# Patient Record
Sex: Female | Born: 1995 | Race: White | Hispanic: No | Marital: Single | Smoking: Never smoker
Health system: Southern US, Community
[De-identification: ages and names within clinical notes are randomized; demographics above are authoritative.]

## PROBLEM LIST (undated history)

## (undated) DIAGNOSIS — F32A Depression, unspecified: Secondary | ICD-10-CM

## (undated) DIAGNOSIS — F909 Attention-deficit hyperactivity disorder, unspecified type: Secondary | ICD-10-CM

## (undated) DIAGNOSIS — F411 Generalized anxiety disorder: Secondary | ICD-10-CM

## (undated) DIAGNOSIS — J45909 Unspecified asthma, uncomplicated: Secondary | ICD-10-CM

## (undated) DIAGNOSIS — F329 Major depressive disorder, single episode, unspecified: Secondary | ICD-10-CM

## (undated) HISTORY — DX: Generalized anxiety disorder: F41.1

## (undated) HISTORY — DX: Unspecified asthma, uncomplicated: J45.909

## (undated) HISTORY — DX: Attention-deficit hyperactivity disorder, unspecified type: F90.9

## (undated) HISTORY — PX: WISDOM TOOTH EXTRACTION: SHX21

---
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
## 2013-01-08 ENCOUNTER — Other Ambulatory Visit: Payer: Self-pay | Admitting: Family Medicine

## 2013-01-08 NOTE — Telephone Encounter (Signed)
Looks like it was last refilled 5/19 with an additional refill, so it should last until her July appt.  Verify with pharmacy. It looks like they have been contacting us monthly for refill, even though additional refill was given (4/25 and 5/19 refilled)

## 2013-01-08 NOTE — Telephone Encounter (Signed)
Is this okay to refill? She has a CPE for college scheduled for next month.

## 2013-01-24 ENCOUNTER — Ambulatory Visit (INDEPENDENT_AMBULATORY_CARE_PROVIDER_SITE_OTHER): Payer: Commercial Managed Care - PPO | Admitting: Family Medicine

## 2013-01-24 ENCOUNTER — Encounter: Payer: Self-pay | Admitting: Family Medicine

## 2013-01-24 VITALS — BP 102/62 | HR 72 | Ht 68.0 in | Wt 136.0 lb

## 2013-01-24 DIAGNOSIS — N943 Premenstrual tension syndrome: Secondary | ICD-10-CM

## 2013-01-24 DIAGNOSIS — J45990 Exercise induced bronchospasm: Secondary | ICD-10-CM

## 2013-01-24 DIAGNOSIS — Z0000 Encounter for general adult medical examination without abnormal findings: Secondary | ICD-10-CM

## 2013-01-24 DIAGNOSIS — N946 Dysmenorrhea, unspecified: Secondary | ICD-10-CM

## 2013-01-24 DIAGNOSIS — R142 Eructation: Secondary | ICD-10-CM

## 2013-01-24 DIAGNOSIS — F3281 Premenstrual dysphoric disorder: Secondary | ICD-10-CM

## 2013-01-24 DIAGNOSIS — R141 Gas pain: Secondary | ICD-10-CM

## 2013-01-24 MED ORDER — DEXLANSOPRAZOLE 60 MG PO CPDR: 60.0000 mg | DELAYED_RELEASE_CAPSULE | Freq: Every day | ORAL | Status: DC

## 2013-01-24 MED ORDER — DROSPIRENONE-ETHINYL ESTRADIOL 3-0.02 MG PO TABS: ORAL_TABLET | ORAL | Status: DC

## 2013-01-24 MED ORDER — ALBUTEROL SULFATE HFA 108 (90 BASE) MCG/ACT IN AERS: 2.0000 | INHALATION_SPRAY | Freq: Four times a day (QID) | RESPIRATORY_TRACT | Status: DC | PRN

## 2013-01-24 NOTE — Progress Notes (Signed)
Chief Complaint  Patient presents with  . Annual Exam    college CPE. Needs rx for birth control pills and albuterol inhaler. Would like to learn how to do self breast exam. Still concerned over her burping issues.    Meagan Miller is a 17 y.o. female who presents for a complete physical.  She is accompanied by her mother. She has the following concerns:  She had her wisdom teeth all removed yesterday.  Is taking pain pills, and had nausea/vomited while waiting for visit today, as side effect of pain pills.  She is lying down, sipping water, currently feeling a little better.  Burping is less than it used to be, but is still loud and bothers her parents.  Not related to meals/eating, occurs several times/day.  Denies dysphagia.  Only took PPI for a few days, caused her to pass gas, so didn't try it again.  Never tried changing PPI to the prevacid as previously suggested (had the gas from prilosec OTC).  She denies actual heartburn, dysphagia, or other bowel changes.  Anxiety is improved.  She has been working out a lot, which helps reduce her stress. She is down 10 pounds since her last visit. She saw Willette Alma (after Dr. Dub Mikes), and tried short-acting Focalin BID around exam time, which didn't help, so then retried Adderall, which helped the most, but made her sick (nausea). She has also tried concerta, adderall, strattera, Wellbutrin and vyvanse in the past (all since 9th grade).    Exercise-induced asthma--Hasn't needed to use inhaler in quite a while.  Previously used it prior to soccer practice and games, and she didn't play soccer this year.  Doesn't need it for working out at the gym.  Dysmenorrhea and PMDD--has been on OCP's since last visit, and periods are lighter, shorter, less painful, and she is less emotional. She denies side effects, and is requesting refill.  She does not have a boyfriend, and has never been sexually active.  Going to [employer] in the fall.  Immunization  History  Administered Date(s) Administered  . DTaP 10/04/1994, 11/18/1994, 01/31/1995, 11/03/1995, 12/10/1999  . HPV Quadrivalent 04/27/2006, 06/29/2006, 12/27/2007  . Hepatitis A 04/26/2005, 04/27/2006  . Hepatitis B 29-Apr-1994, 08/25/1994, 04/25/1995  . HiB 10/04/1994, 11/18/1994, 01/31/1995, 11/03/1995  . IPV 10/04/1994, 11/18/1994, 01/31/1995, 12/10/1999  . Influenza Split 08/04/2009, 09/06/2011, 04/24/2012  . Influenza Whole 05/19/2004, 04/26/2005, 04/27/2006  . MMR 07/25/1995, 12/10/1999  . Meningococcal Conjugate 12/27/2007, 09/06/2011  . Tdap 04/26/2005, 09/09/2011  . Varicella 07/25/1995, 12/27/2007   Last Pap smear: never Dentist: a few months ago; wisdom teeth removed yesterday Ophtho: never Exercise: 5x/week  Past Medical History  Diagnosis Date  . Depression   . Generalized anxiety disorder     previously treated with zoloft (Dr. Dub Mikes, Dr. Hal Hope)  . ADHD (attention deficit hyperactivity disorder) 9th grade  . Asthma     Past Surgical History  Procedure Laterality Date  . Wisdom tooth extraction      History   Social History  . Marital Status: Single    Spouse Name: N/A    Number of Children: N/A  . Years of Education: N/A   Occupational History  . student    Social History Main Topics  . Smoking status: Never Smoker   . Smokeless tobacco: Not on file  . Alcohol Use: No  . Drug Use: No  . Sexually Active: Not on file   Other Topics Concern  . Not on file   Social History Narrative  Graduated from [employer].  Lives with parents, sister.  Going to [employer] in the fall    Family History  Problem Relation Age of Onset  . Asthma Mother   . Anxiety disorder Mother   . Cancer Father 29    colon  . Asthma Sister   . Anxiety disorder Sister   . Cancer Maternal Aunt     great aunt--ovarian cancer  . Anxiety disorder Maternal Aunt   . Anxiety disorder Maternal Uncle   . Anxiety disorder Maternal Grandmother   . Cancer Cousin      breast cancer (1st cousins once removed)--late 71's for 2 cousins  . Breast cancer Cousin   . Stroke Cousin    Current outpatient prescriptions:drospirenone-ethinyl estradiol (GIANVI) 3-0.02 MG tablet, TAKE 1 TABLET BY MOUTH DAILY., Disp: 28 tablet, Rfl: 11;  albuterol (PROVENTIL HFA;VENTOLIN HFA) 108 (90 BASE) MCG/ACT inhaler, Inhale 2 puffs into the lungs every 6 (six) hours as needed for wheezing., Disp: 1 Inhaler, Rfl: 1;  dexlansoprazole (DEXILANT) 60 MG capsule, Take 1 capsule (60 mg total) by mouth daily., Disp: 5 capsule, Rfl: 0 (Dexilant sample given today, NOT prior to visit)  No Known Allergies  ROS: +intentional weight loss.  No fevers, URI symptoms, headaches, chest pain, palpitations, nausea, vomiting, bowel changes, vaginal discharge, urinary complaints, joint pains, bleeding/bruising, skin lesions/rashes, cough, shortness of breath.  Denies depression.  Anxiety is improved.  See HPI  PHYSICAL EXAM: BP 102/62  Pulse 72  Ht 5\' 8"  (1.727 m)  Wt 136 lb (61.689 kg)  BMI 20.68 kg/m2  LMP 01/11/2013 Pleasant, thin female in no distress HEENT:  PERRL, EOMI, conjunctiva clear.  Fundi benign.  TM's and EAC's normal, OP clear. Neck: no lymphadenopathy, thyromegaly or mass Heart: regular rate and rhythm without murmur Lungs: clear bilaterally Breasts:  No nipple discharge, inversion, dominant masses or axillary lymphadenopathy Abdomen: soft, nontender, no mass Extremities: no edema, 2+ pulse Back: no CVA tenderness, spinal tenderness or scoliosis Psych: normal mood, affect, hygiene and grooming Neuro: alert and oriented.  Cranial nerves intact.  Normal strength, sensation, DTR's.  ASSESSMENT/PLAN:  Routine general medical examination at a health care facility - Plan: POCT Urinalysis Dipstick, Visual acuity screening  Dysmenorrhea - Plan: drospirenone-ethinyl estradiol (GIANVI) 3-0.02 MG tablet  PMDD (premenstrual dysphoric disorder) - improved with OCP's - Plan:  drospirenone-ethinyl estradiol (GIANVI) 3-0.02 MG tablet  Belching - as previously discussed with Dr. Elnoria Howard, likely has no increased long-term risks.  she will try PPI to see if belching improves.  Dexilant samples given - Plan: dexlansoprazole (DEXILANT) 60 MG capsule  Exercise-induced asthma - needs refill to have inhaler available for school - Plan: albuterol (PROVENTIL HFA;VENTOLIN HFA) 108 (90 BASE) MCG/ACT inhaler  Trial of PPI just to see if symptoms improve or not.  Samples given of Dexilant, just to see if belching improves, not that she needs to take it longterm.  Can also try OTC Prevacid prn.  Reviewed risks of untreated reflux, but discussed that her symptoms (belching) may not necessarily truly represent reflux.  Counseled extensively regarding avoidance of smoking, alcohol, drugs; abstinence encouraged, safe sex reviewed (especially since she is on OCP's for her PMDD).  Counseled re: sunscreen, healthy eating, safe driving habits. All questions answered.  Immunizations UTD.  Forms filled out for school  Nausea and vomiting, likely related to narcotic.  Recommended stopping narcotic pain meds, and use tylenol or ibuprofen as needed for pain instead.

## 2013-01-24 NOTE — Patient Instructions (Signed)
HEALTH MAINTENANCE RECOMMENDATIONS:  It is recommended that you get at least 30 minutes of aerobic exercise at least 5 days/week (for weight loss, you may need as much as 60-90 minutes). This can be any activity that gets your heart rate up. This can be divided in 10-15 minute intervals if needed, but try and build up your endurance at least once a week.  Weight bearing exercise is also recommended twice weekly.  Eat a healthy diet with lots of vegetables, fruits and fiber.  "Colorful" foods have a lot of vitamins (ie green vegetables, tomatoes, red peppers, etc).  Limit sweet tea, regular sodas and alcoholic beverages, all of which has a lot of calories and sugar.  Up to 1 alcoholic drink daily may be beneficial for women (unless trying to lose weight, watch sugars).  Drink a lot of water.  Calcium recommendations are 1200-1500 mg daily (1500 mg for postmenopausal women or women without ovaries), and vitamin D 1000 IU daily.  This should be obtained from diet and/or supplements (vitamins), and calcium should not be taken all at once, but in divided doses.  Monthly self breast exams and yearly mammograms for women over the age of 75 is recommended.  Sunscreen of at least SPF 30 should be used on all sun-exposed parts of the skin when outside between the hours of 10 am and 4 pm (not just when at beach or pool, but even with exercise, golf, tennis, and yard work!)  Use a sunscreen that says "broad spectrum" so it covers both UVA and UVB rays, and make sure to reapply every 1-2 hours.  Remember to change the batteries in your smoke detectors when changing your clock times in the spring and fall.  Use your seat belt every time you are in a car, and please drive safely and not be distracted with cell phones and texting while driving.   Prevacid is the over-the-counter acid medication that you can try to see if it helps with belching.  If it doesn't help, you do not need to continue to take it.

## 2013-01-29 IMAGING — CR DG SKULL COMPLETE 4+V
4 series · 4 of 4 positions shown · non-contrast
Comparison: None.

CLINICAL DATA: Head trauma 3 days ago.  Pain.

SKULL - COMPLETE 4 + VIEW

[[person_name]]
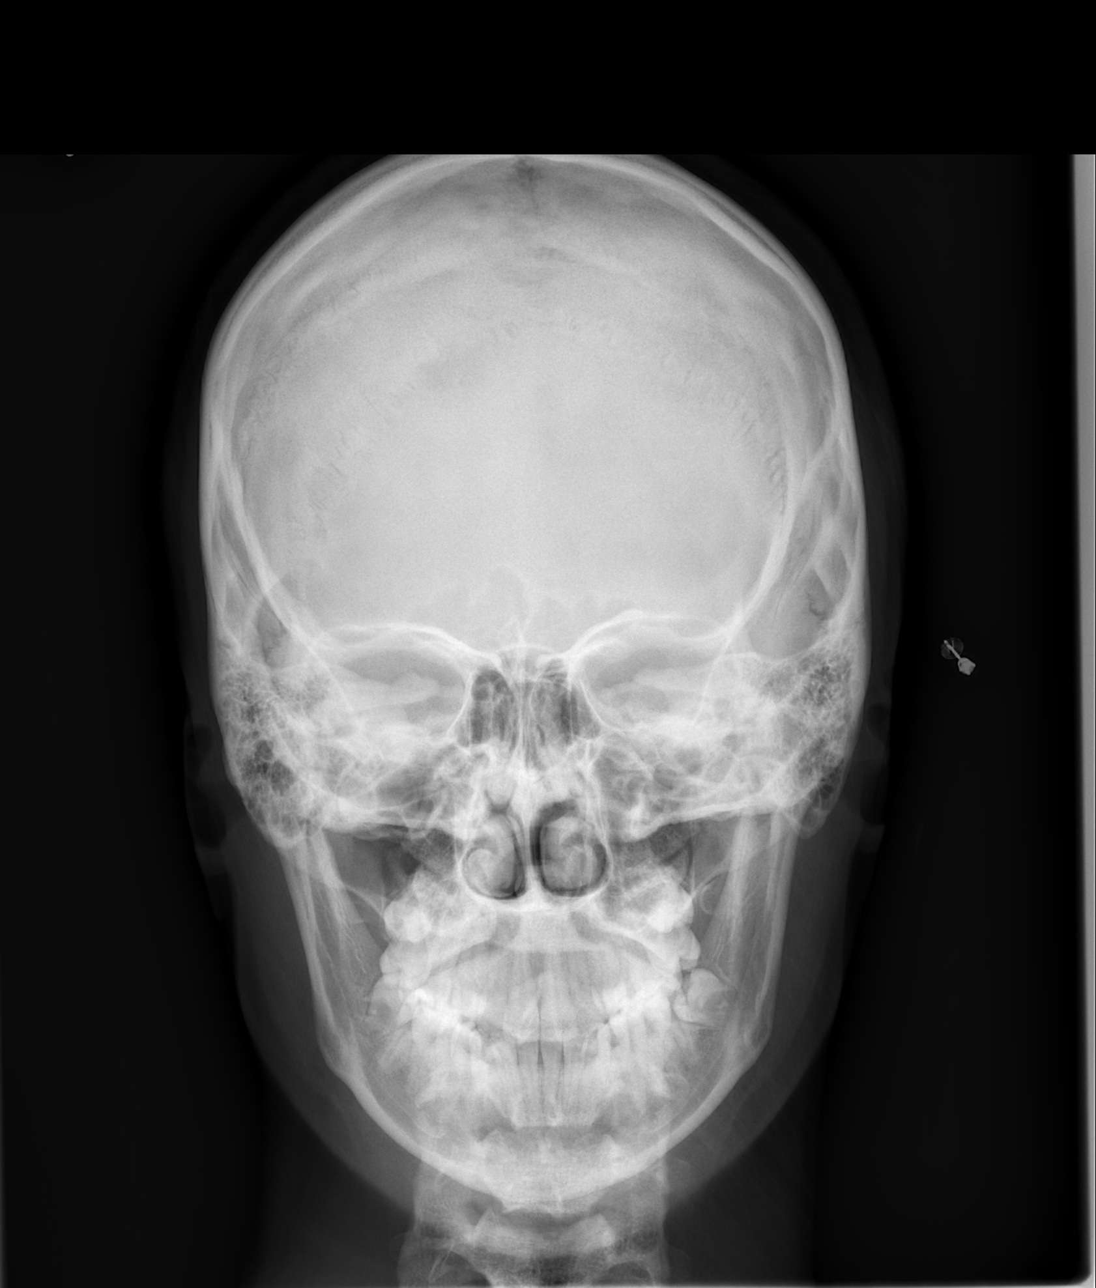

[w townes]
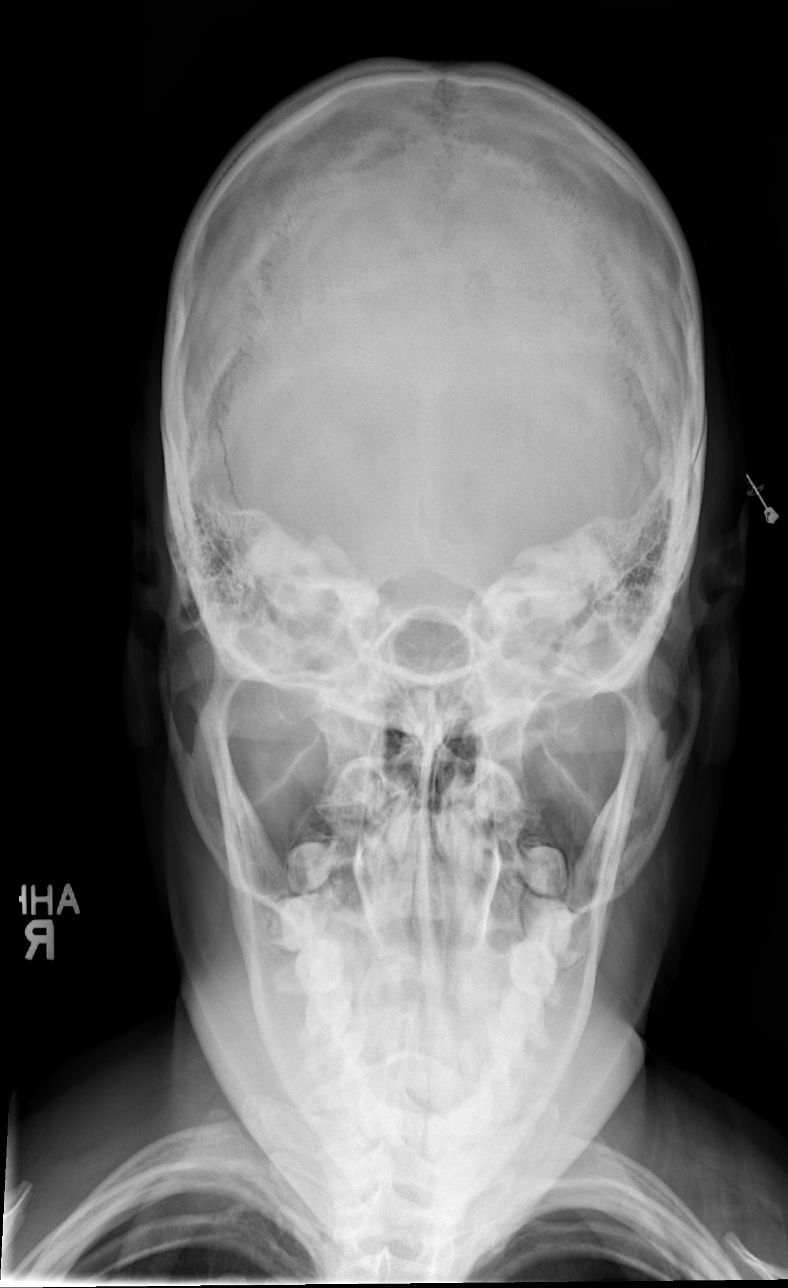

[w skull lat (1 of 2)]
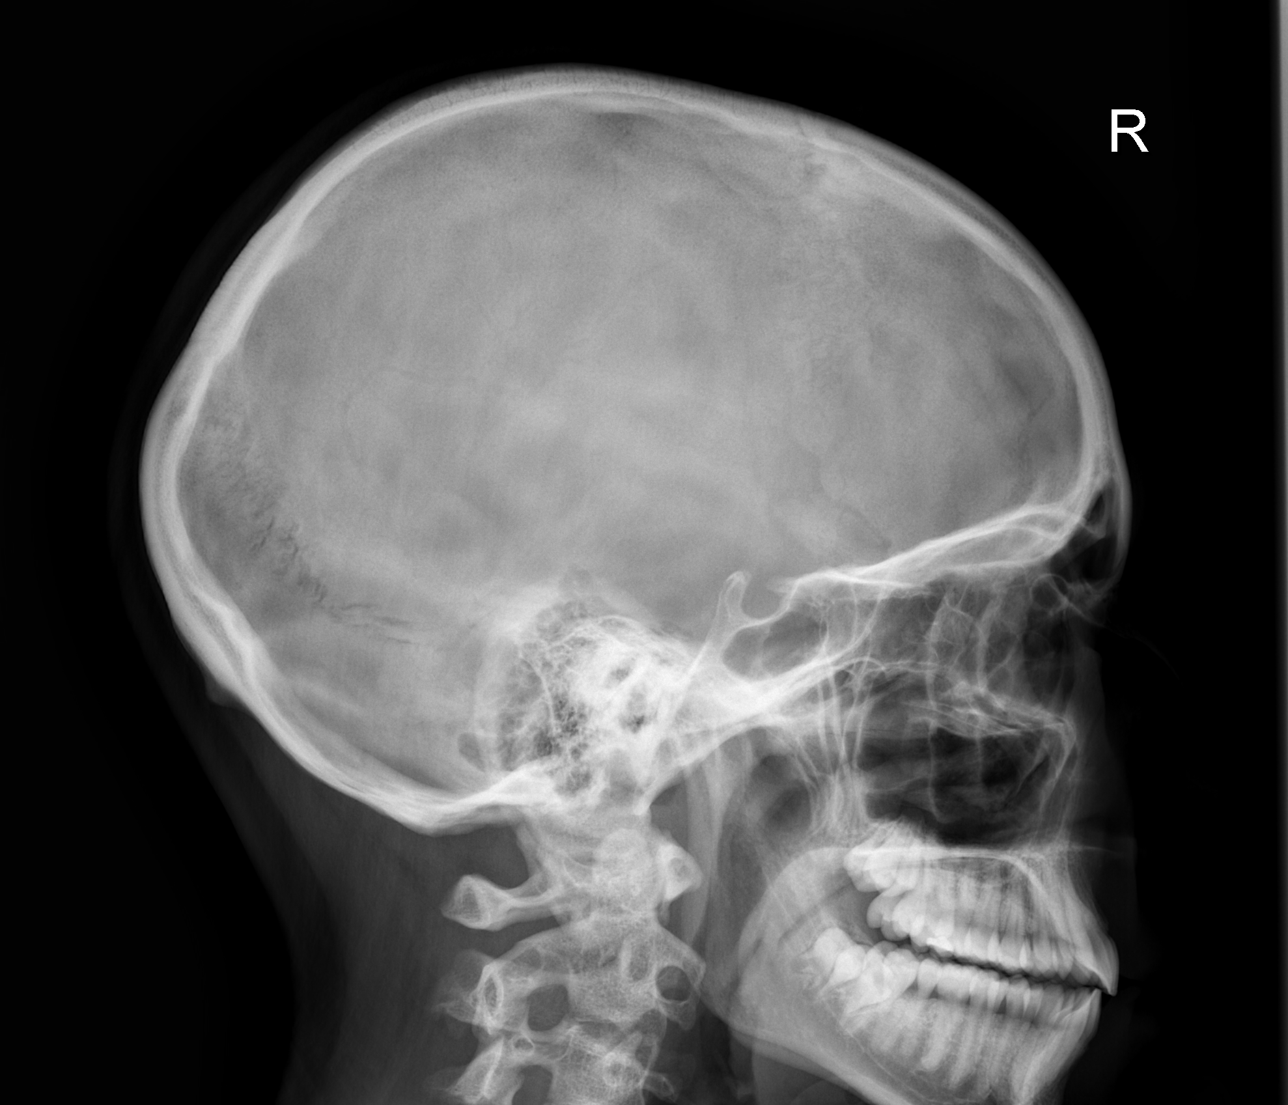

[w skull lat (2 of 2)]
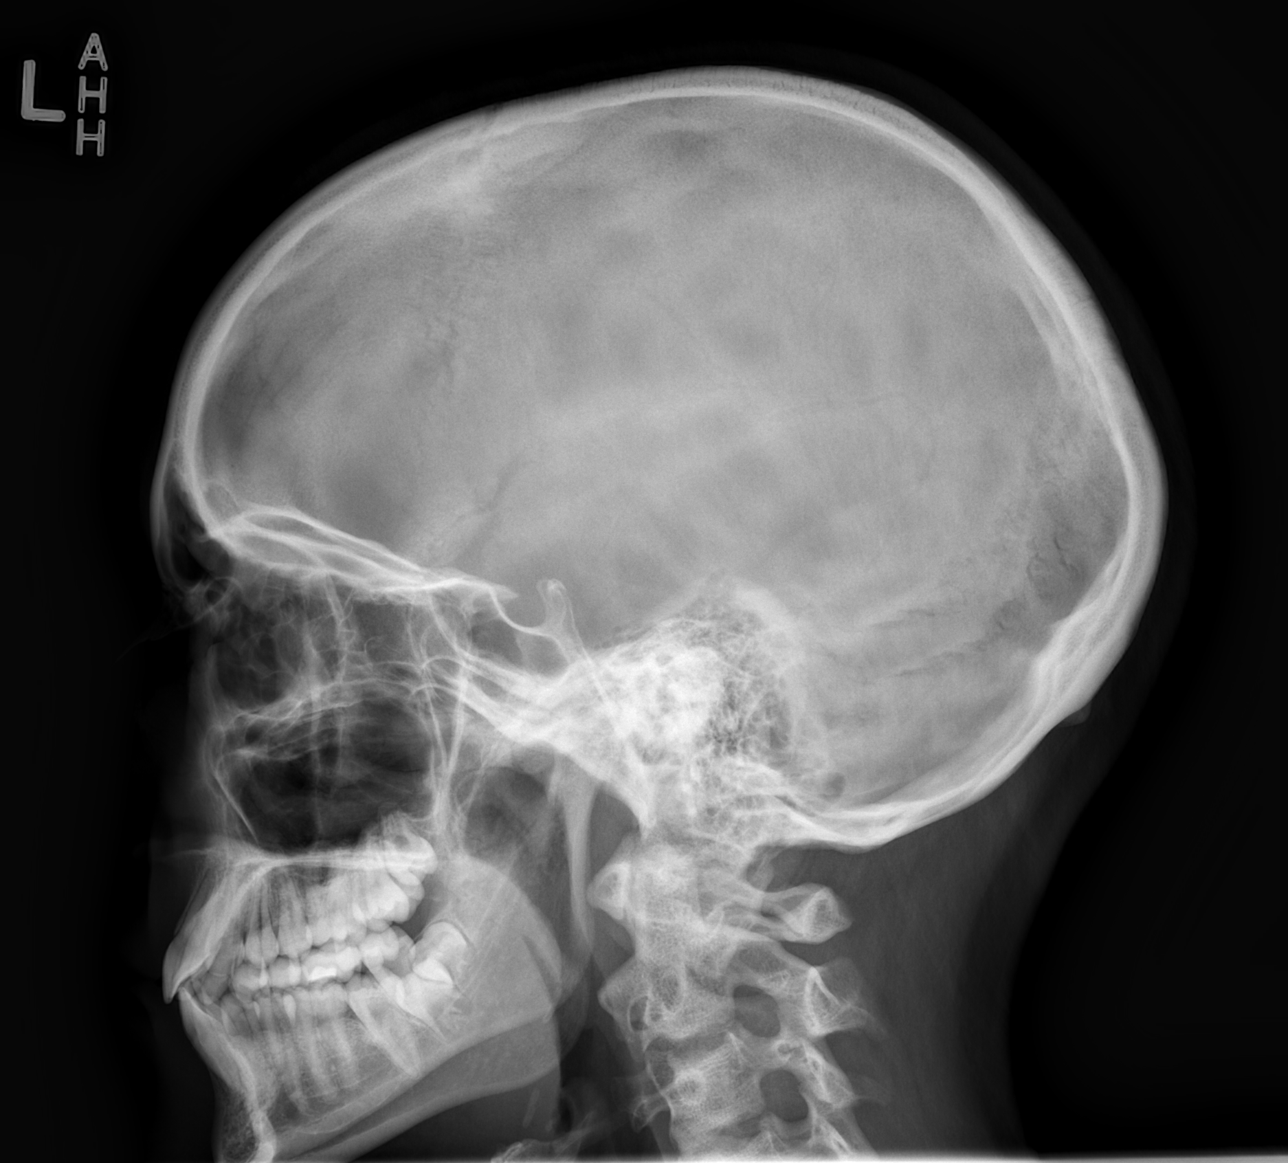

[4 of 4 positions shown; findings below may reference images not displayed]

FINDINGS: On the Townes view, there is a sharp linear lucency noted
in the left temporal region.  There is mild overlying soft tissue
swelling.  I do not see this on any additional views, but I cannot
exclude a nondisplaced fracture.
IMPRESSION: Findings suspicious for a nondisplaced left sided fracture, only
seen on the Towne's view.  Recommend CT head for further
evaluation.

These results were called to Dr. Fairfri at the time of
interpretation.

## 2013-01-30 ENCOUNTER — Other Ambulatory Visit: Payer: Commercial Managed Care - PPO

## 2013-02-23 ENCOUNTER — Encounter: Payer: Self-pay | Admitting: Medical

## 2013-02-23 ENCOUNTER — Ambulatory Visit (INDEPENDENT_AMBULATORY_CARE_PROVIDER_SITE_OTHER): Payer: Commercial Managed Care - PPO | Admitting: Medical

## 2013-02-23 VITALS — BP 110/68 | HR 88 | Temp 98.6°F | Ht 68.0 in | Wt 134.0 lb

## 2013-02-23 DIAGNOSIS — J45909 Unspecified asthma, uncomplicated: Secondary | ICD-10-CM

## 2013-02-23 DIAGNOSIS — J069 Acute upper respiratory infection, unspecified: Secondary | ICD-10-CM

## 2013-02-23 NOTE — Progress Notes (Signed)
Subjective; Here for sore throat, cough, nasal drainage down back of throat x  2-3 days, hoarse voice too.   Denies fever, no NVD, no rash.   No ear pain, no sinus pressure.  Has gotten sinus infections in the past, but this doesn't particularly feel that way.   Is getting some productive cough.  Has had some friends sick recently with similar.  Using nothing for symptoms, no other aggravating or relieving factors.   PMH significant for asthma, mild.   ROS as in subjective  Objective: Filed Vitals:   02/23/13 1523  BP: 110/68  Pulse: 88  Temp: 98.6 F (37 C)    General appearance: alert, no distress, WD/WN HEENT: normocephalic, sclerae anicteric, sinus nontender, TMs pearly, nares patent ,but with some purulent/dry discharge slight erythema, pharynx with mild erythema Oral cavity: MMM, no lesions Neck: supple, no lymphadenopathy, no thyromegaly, no masses Lungs: CTA bilaterally, no wheezes, rhonchi, or rales   Assessment: Encounter Diagnoses  Name Primary?  Marland Kitchen Upper respiratory infection Yes  . Unspecified asthma(493.90)     Plan: Discussed diagnosis, supportive care, rest, hydrate well, and if worse or not improving, call or return.  Her asthma is usually mild intermittent.  Follow-up prn.

## 2013-02-23 NOTE — Patient Instructions (Signed)
Currently your exam and symptoms suggest a viral respiratory infection:  Begin OTC Robitussin DM or other cough/decongestant medication for cold and cough.    Rest  Drink plenty of water  Use salt water gargles and warm beverages such as tea and coffee to sooth the throat.  Don't talk very much the next few days to give your voice rest.  Use neti pot/nasal saline to flush mucous from nose.  Use your Albuterol inhaler, 2 puffs every 6 hours as needed for wheezing and shortness of breath.    If worse, fever, worse chest congestion, not improving, then call back or get re-evaluated.

## 2013-04-03 ENCOUNTER — Other Ambulatory Visit: Payer: Self-pay | Admitting: Family Medicine

## 2013-10-11 ENCOUNTER — Telehealth: Payer: Self-pay | Admitting: Family Medicine

## 2013-10-12 ENCOUNTER — Telehealth: Payer: Self-pay | Admitting: Family Medicine

## 2013-10-12 NOTE — Telephone Encounter (Signed)
TIER 1, Orthotricyclen, Orthomicronor, Altavera, Gildess Fe, Murvin NatalLutera, Natazia, Microgestin Fe, Trivora-28, [place]ia  TIER 2, RedbirdAzurette, [place], P[place]el, Kariva, Quasense, Yaz, Viorele  Dad Darryl would like if at all possible for pt to be switched to a cheaper birth control due to insurance change, current birth control is Tier 3

## 2013-10-12 NOTE — Telephone Encounter (Signed)
Advise dad--I think we changed her to Yaz in 03/2012 (from ortho cyclen, previously prescribed by Dr. Hal Hopeichter).  Her current med is listed as Helen HashimotoGianvi, which is the same hormones.  Dianah FieldYaz is listed at Tier 2.  Changing to yaz will be the same; vs changing to orthotricyclen (tier 1)--this will be less expensive, but more similar to the med she was on prior to her current pill (but triphasic, a different strength of hormone each week, rather than the same pill for 3 weeks).  My recommendation if she is doing well on her current pill, is to change to the Yaz (which is the same medication).  If this is affordable, (rather than wanting to change to a Tier 1 med such as orthotricyclen), then okay to go ahead and call in x 4 packs (she is due for CPE in July, and she should go ahead and get this scheduled now rather than waiting, as appointments get booked out)

## 2013-10-12 NOTE — Telephone Encounter (Signed)
I have tired to call on 3 different telephone numbers and I have left messages. CLS

## 2013-10-12 NOTE — Telephone Encounter (Signed)
Left message for father to call ins company & find out which birth control is on lower tier & call back with info & will see if Dr. Lynelle DoctorKnapp wants to switch

## 2013-10-15 NOTE — Telephone Encounter (Signed)
Left message on mother, Meagan CreamerJulie Miller's voicemail as I do not have a number for father, Darryl. And patient's voicemail box is full.

## 2013-10-16 MED ORDER — DROSPIRENONE-ETHINYL ESTRADIOL 3-0.02 MG PO TABS: 1.0000 | ORAL_TABLET | Freq: Every day | ORAL | Status: DC

## 2013-10-16 NOTE — Telephone Encounter (Signed)
Spoke with dad and he is wanting her to be on YAZ for the month of April and if it works well she will stay on it and if not then he wants her to switched to something in tier 1. Pt has an appt July 15th for a physical with you. Dad will call back and tell me which pharmacy to send to. If any other questions dad can be reached at [phone]

## 2013-10-16 NOTE — Telephone Encounter (Signed)
Ok to send in rx for Meagan Miller once you get the pharmacy, to last until her July appt (we can always change sooner if he prefers)

## 2013-10-16 NOTE — Telephone Encounter (Signed)
Sent yaz to pharmacy in [place]

## 2013-10-31 IMAGING — CR DG WRIST COMPLETE 3+V*R*
2 series · 2 of 2 positions shown · non-contrast
Comparison: None.

CLINICAL DATA: Injury to right wrist during soccer game.  Right
wrist pain.

RIGHT WRIST - COMPLETE 3+ VIEW

[view not recorded (1 of 2)]
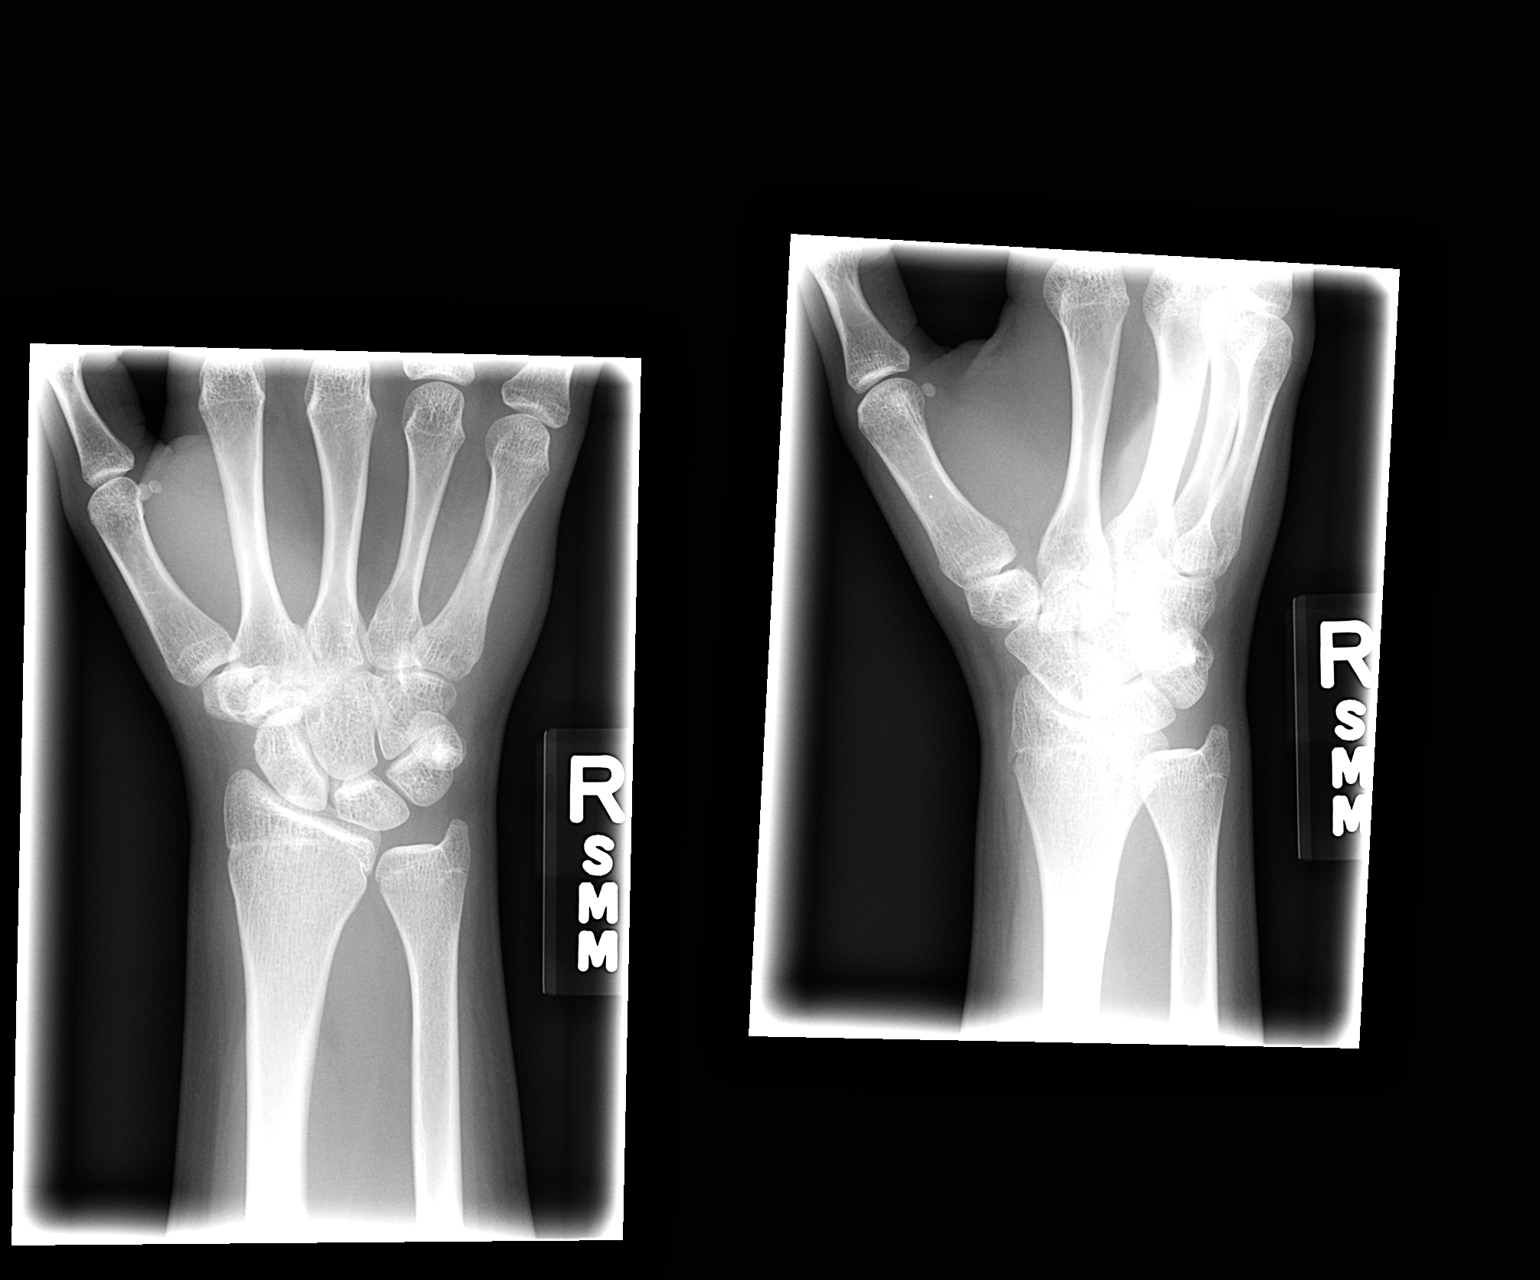

[view not recorded (2 of 2)]
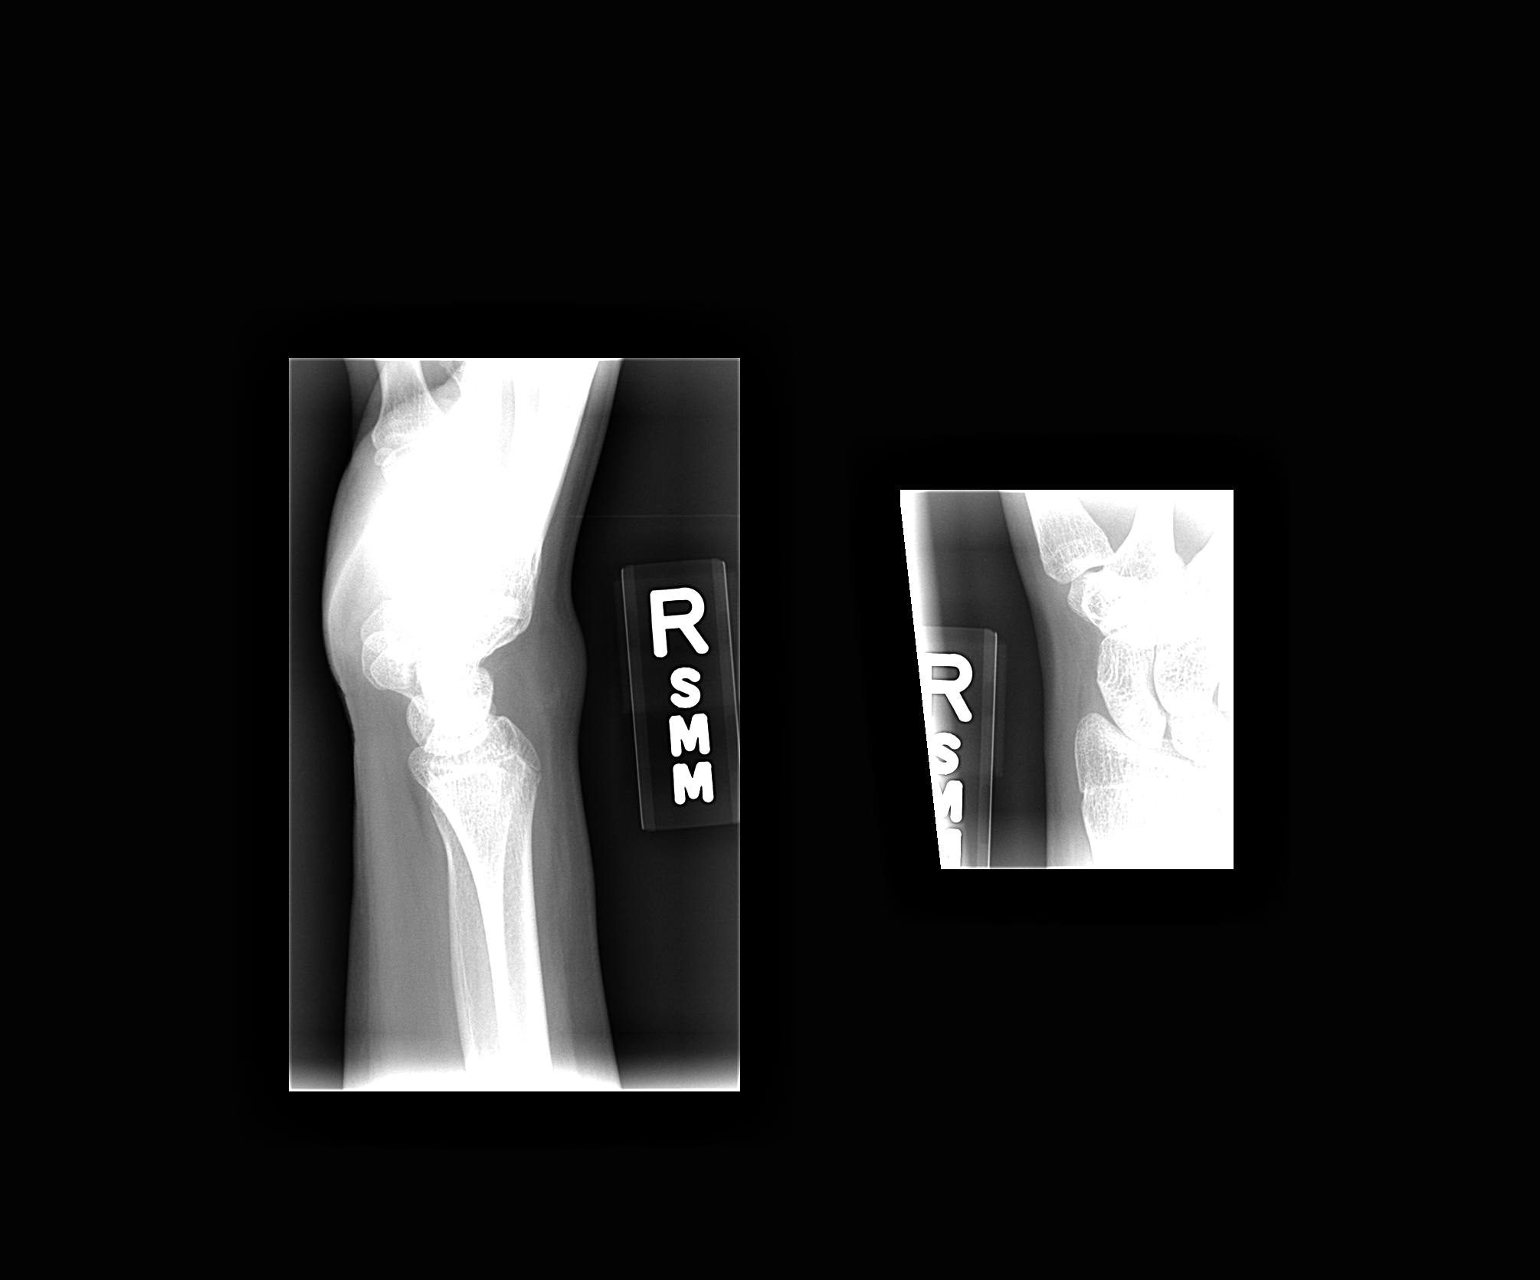

[2 of 2 positions shown; findings below may reference images not displayed]

FINDINGS: There is no evidence of fracture or dislocation.  The
carpal rows are intact, and demonstrate normal alignment.  The
joint spaces are preserved.

No significant soft tissue abnormalities are seen.
IMPRESSION: No evidence of fracture or dislocation.

## 2013-11-07 ENCOUNTER — Telehealth: Payer: Self-pay | Admitting: Internal Medicine

## 2013-11-07 MED ORDER — DROSPIRENONE-ETHINYL ESTRADIOL 3-0.02 MG PO TABS: 1.0000 | ORAL_TABLET | Freq: Every day | ORAL | Status: DC

## 2013-11-07 NOTE — Telephone Encounter (Signed)
Dad called and states that pharmacy did not have med and he had to pay $25 dollars for this child birth control. i have called pharmacy and pharmacy stated they never got the rx so i resent it in then they found the rx. They had an old rx from back in September that they were filling. They will fill med for pt   Dad was notified on vm that med was corrected in system and sent in again and that his daughter can go and pick up

## 2013-11-07 NOTE — Telephone Encounter (Signed)
Pharmacy called and states that they did not received the rx i sent today. i faxed it to them @ [phone] and spoke with beth

## 2014-01-03 ENCOUNTER — Encounter (HOSPITAL_COMMUNITY): Payer: Self-pay | Admitting: Emergency Medicine

## 2014-01-03 ENCOUNTER — Emergency Department (HOSPITAL_COMMUNITY)
Admission: EM | Admit: 2014-01-03 | Discharge: 2014-01-03 | Disposition: A | Discharge status: Home / Self Care | Payer: No Typology Code available for payment source | Source: Home / Self Care | Attending: Family Medicine | Admitting: Family Medicine

## 2014-01-03 ENCOUNTER — Emergency Department (INDEPENDENT_AMBULATORY_CARE_PROVIDER_SITE_OTHER): Payer: No Typology Code available for payment source

## 2014-01-03 DIAGNOSIS — S62309A Unspecified fracture of unspecified metacarpal bone, initial encounter for closed fracture: Secondary | ICD-10-CM

## 2014-01-03 DIAGNOSIS — IMO0002 Reserved for concepts with insufficient information to code with codable children: Secondary | ICD-10-CM

## 2014-01-03 DIAGNOSIS — S62306A Unspecified fracture of fifth metacarpal bone, right hand, initial encounter for closed fracture: Secondary | ICD-10-CM

## 2014-01-03 MED ORDER — IBUPROFEN 600 MG PO TABS: 600.0000 mg | ORAL_TABLET | Freq: Once | ORAL | Status: DC

## 2014-01-03 MED ORDER — IBUPROFEN 800 MG PO TABS
800.0000 mg | ORAL_TABLET | Freq: Once | ORAL | Status: AC
  Administered 2014-01-03: 800 mg via ORAL

## 2014-01-03 MED ORDER — IBUPROFEN 800 MG PO TABS
ORAL_TABLET | ORAL | Status: AC
  Filled 2014-01-03: qty 1

## 2014-01-03 NOTE — ED Notes (Signed)
Ice to hand

## 2014-01-03 NOTE — ED Provider Notes (Signed)
CSN: [id number]     Arrival date & time 01/03/14  [id number] History   First MD Initiated Contact with Patient 01/03/14 [phone]     Chief Complaint  Patient presents with  . Hand Injury   (Consider location/radiation/quality/duration/timing/severity/associated sxs/prior Treatment) Patient is a 18 y.o. female presenting with hand injury. The history is provided by the patient.  Hand Injury Location:  Hand Time since incident:  1 day Injury: yes   Mechanism of injury comment:  Struck hand on ironing board> states she was mad during an argument with her sister and Engineer, structuralhit ironing board. Hand location:  R hand Dislocation: no   Foreign body present:  No foreign bodies Prior injury to area:  No Associated symptoms: swelling     Past Medical History  Diagnosis Date  . Depression   . Generalized anxiety disorder     previously treated with zoloft (Dr. Dub MikesLugo, Dr. Hal Hopeichter)  . ADHD (attention deficit hyperactivity disorder) 9th grade  . Asthma    Past Surgical History  Procedure Laterality Date  . Wisdom tooth extraction     Family History  Problem Relation Age of Onset  . Asthma Mother   . Anxiety disorder Mother   . Cancer Father 2145    colon  . Asthma Sister   . Anxiety disorder Sister   . Cancer Maternal Aunt     great aunt--ovarian cancer  . Anxiety disorder Maternal Aunt   . Anxiety disorder Maternal Uncle   . Anxiety disorder Maternal Grandmother   . Cancer Cousin     breast cancer (1st cousins once removed)--late 3940's for 2 cousins  . Breast cancer Cousin   . Stroke Cousin    History  Substance Use Topics  . Smoking status: Never Smoker   . Smokeless tobacco: Not on file  . Alcohol Use: No   OB History   Grav Para Term Preterm Abortions TAB SAB Ect Mult Living                 Review of Systems  All other systems reviewed and are negative.   Allergies  Review of patient's allergies indicates no known allergies.  Home Medications   Prior to Admission medications     Medication Sig Start Date End Date Taking? Authorizing Provider  drospirenone-ethinyl estradiol (YAZ) 3-0.02 MG tablet Take 1 tablet by mouth daily. 11/07/13  Yes Joselyn ArrowEve Knapp, MD  albuterol (PROVENTIL HFA;VENTOLIN HFA) 108 (90 BASE) MCG/ACT inhaler Inhale 2 puffs into the lungs every 6 (six) hours as needed for wheezing. 01/24/13   Joselyn ArrowEve Knapp, MD  dexlansoprazole (DEXILANT) 60 MG capsule Take 1 capsule (60 mg total) by mouth daily. 01/24/13   Joselyn ArrowEve Knapp, MD   BP 132/80  Pulse 82  Temp(Src) 98.5 F (36.9 C) (Oral)  Resp 16  SpO2 100%  LMP 12/31/2013 Physical Exam  Nursing note and vitals reviewed. Constitutional: She is oriented to person, place, and time. She appears well-developed and well-nourished. No distress.  HENT:  Head: Normocephalic and atraumatic.  Eyes: Conjunctivae are normal. No scleral icterus.  Cardiovascular: Normal rate.   Pulmonary/Chest: Effort normal.  Musculoskeletal: Normal range of motion.       Right hand: She exhibits tenderness, bony tenderness and swelling. She exhibits normal range of motion, normal two-point discrimination, normal capillary refill, no deformity and no laceration. Normal sensation noted. Normal strength noted.       Hands: Neurological: She is alert and oriented to person, place, and time.  Skin: Skin is warm  and dry. No rash noted. No erythema.  +skin intact  Psychiatric: She has a normal mood and affect. Her behavior is normal.    ED Course  Procedures (including critical care time) Labs Review Labs Reviewed - No data to display  Imaging Review Dg Finger Little Right  01/03/2014   CLINICAL DATA:  Finger injury  EXAM: RIGHT LITTLE FINGER 2+V  COMPARISON:  None.  FINDINGS: Fracture of the distal fifth metacarpal. Based on these views there is no significant displacement. No definite involvement of the MCP joint. Remainder of the fifth finger is negative.  IMPRESSION: Nondisplaced fracture distal fifth metacarpal.   Electronically Signed   By:  Marlan Palau M.D.   On: 01/03/2014 09:15     MDM   1. Fracture of fifth metacarpal bone of right hand    Films with ND fx at prox 5th MC. Will place in short ulnar gutter splint and refer to Dr. Izora Ribas (hand ortho) for follow up. RICE therapy at home.    Jess Barters [place], [place] 01/03/14 [phone]

## 2014-01-03 NOTE — ED Notes (Signed)
States she accidentally struck her right 5th finger on ironing board yesterday , and has pain same , MP joint

## 2014-01-03 NOTE — Progress Notes (Signed)
Orthopedic Tech Progress Note Patient Details:  Meagan Miller Dec 22, 1995 [id number] applied Ortho Devices Type of Ortho Device: Ace wrap;Ulna gutter splint Ortho Device/Splint Location: RUE Ortho Device/Splint Interventions: Application   [name] 01/03/2014, 9:57 AM

## 2014-01-03 NOTE — Discharge Instructions (Signed)
Please keep splint clear, dry and in place until you follow up with the hand orthopedist listed on your discharge paperwork (Dr. Izora Ribas). Please call to set up appointment. Ice and elevate 3-4 x day to minimize swelling. Ibuprofen as directed on packaging for pain.  Boxer's Fracture You have a break (fracture) of the fifth metacarpal bone. This is commonly called a boxer's fracture. This is the bone in the hand where the little finger attaches. The fracture is in the end of that bone, closest to the little finger. It is usually caused when you hit an object with a clenched fist. Often, the knuckle is pushed down by the impact. Sometimes, the fracture rotates out of position. A boxer's fracture will usually heal within 6 weeks, if it is treated properly and protected from re-injury. Surgery is sometimes needed. A cast, splint, or bulky hand dressing may be used to protect and immobilize a boxer's fracture. Do not remove this device or dressing until your caregiver approves. Keep your hand elevated, and apply ice packs for 15-20 minutes every 2 hours, for the first 2 days. Elevation and ice help reduce swelling and relieve pain. See your caregiver, or an orthopedic specialist, for follow-up care within the next 10 days. This is to make sure your fracture is healing properly. Document Released: 07/12/2005 Document Revised: 10/04/2011 Document Reviewed: 12/30/2006 [facility] Patient Information 2014 [place], [place].

## 2014-01-09 NOTE — ED Provider Notes (Signed)
Medical screening examination/treatment/procedure(s) were performed by a resident physician or non-physician practitioner and as the supervising physician I was immediately available for consultation/collaboration.  Evan Corey, MD    Evan S Corey, MD 01/09/14 0733 

## 2014-02-06 ENCOUNTER — Encounter: Payer: Commercial Managed Care - PPO | Admitting: Family Medicine

## 2014-02-20 ENCOUNTER — Other Ambulatory Visit: Payer: Self-pay | Admitting: Family Medicine

## 2014-02-21 ENCOUNTER — Encounter: Payer: Self-pay | Admitting: Family Medicine

## 2014-02-21 ENCOUNTER — Ambulatory Visit (INDEPENDENT_AMBULATORY_CARE_PROVIDER_SITE_OTHER): Payer: No Typology Code available for payment source | Admitting: Family Medicine

## 2014-02-21 VITALS — BP 102/64 | HR 68 | Ht 68.0 in | Wt 137.0 lb

## 2014-02-21 DIAGNOSIS — N946 Dysmenorrhea, unspecified: Secondary | ICD-10-CM

## 2014-02-21 DIAGNOSIS — F3281 Premenstrual dysphoric disorder: Secondary | ICD-10-CM

## 2014-02-21 DIAGNOSIS — N943 Premenstrual tension syndrome: Secondary | ICD-10-CM

## 2014-02-21 DIAGNOSIS — F411 Generalized anxiety disorder: Secondary | ICD-10-CM

## 2014-02-21 DIAGNOSIS — K219 Gastro-esophageal reflux disease without esophagitis: Secondary | ICD-10-CM

## 2014-02-21 MED ORDER — NORGESTIM-ETH ESTRAD TRIPHASIC 0.18/0.215/0.25 MG-35 MCG PO TABS
1.0000 | ORAL_TABLET | Freq: Every day | ORAL | Status: DC
Start: 2014-02-21 — End: 2014-03-13

## 2014-02-21 NOTE — Patient Instructions (Signed)
Change to the new birth control pills in place of your next pack of Yaz (finish this month's pack). Expect to have it take 2-3 months before knowing how it really works for you. If you do not like it, call to have us refill the Yaz instead.  Continue the nexium daily. Your bloating is more likely related to food/diet (dairy, beans, certain vegetables, etc).  Consider using Bean-o before eating these foods, versus using Simethicone (ie Gas-X) as needed when feeling bloated and gassy.  I agree with recommendation for counseling--to work on stress reduction and coping skills.  If anxiety worsens, return to discuss medications (I don't think they are needed at this point)

## 2014-02-21 NOTE — Progress Notes (Signed)
Chief Complaint  Patient presents with  . Advice Only    currently taking Yaz without any issues, cost is $25 per month. Is considering change in medication due to cost-did bring in list from insurance with less expensive options.    She was started on OCP's originally for PMDD and painful periods. She also is now using it for contraception. She also uses condoms. Her current Yaz prescription costs $25/month.  She is wondering about changing to a different OCP that is on the lower tier. She brings in a list of OCPs.  She has been doing very well with Yaz, without any side effects.  Periods are not painful, they are light, and moods are improved--no longer having random crying.  She is having some increased anxiety (see below).   She was originally started on Ortho Cyclen in 09/2011 (by Dr. Hal Hope), but changed to Yaz/Gianvi in 03/2012 when having more PMDD symtpoms.  She started taking OTC Nexium about a month ago.  She is taking it daily, and belching is not as often.  Still has gassiness and bloating periodically.  She is having more anxiety.  She plans to see Dr. Gilmer Mor (?)--she has seen her in the past.  One day she vomited from anxiety, having more stomach issues--only vomited once, which she relates to anxiety.  But having some bloating and gassiness. She reports having a "sensitive stomach".  Some stress/anxiety related to school--not very happy at [employer]. She is hoping to go abroad to [place].  H/o anxiety and ADHD.  Previously saw Dr. Dub Mikes. She was treated with zoloft and ADD meds.  She had side effects, but she wasn't sure which medication caused the symptoms.  Past Medical History  Diagnosis Date  . Depression   . Generalized anxiety disorder     previously treated with zoloft (Dr. Dub Mikes, Dr. Hal Hope)  . ADHD (attention deficit hyperactivity disorder) 9th grade  . Asthma    Past Surgical History  Procedure Laterality Date  . Wisdom tooth extraction     History   Social  History  . Marital Status: Single    Spouse Name: N/A    Number of Children: N/A  . Years of Education: N/A   Occupational History  . student    Social History Main Topics  . Smoking status: Never Smoker   . Smokeless tobacco: Not on file  . Alcohol Use: No  . Drug Use: No  . Sexual Activity: Not on file   Other Topics Concern  . Not on file   Social History Narrative   Graduated from [employer]. Going to [employer]. Lives with parents, sister.    Outpatient Encounter Prescriptions as of 02/21/2014  Medication Sig  . esomeprazole (NEXIUM) 20 MG capsule Take 20 mg by mouth daily at 12 noon.  . [DISCONTINUED] drospirenone-ethinyl estradiol (YAZ) 3-0.02 MG tablet Take 1 tablet by mouth daily.  Marland Kitchen albuterol (PROVENTIL HFA;VENTOLIN HFA) 108 (90 BASE) MCG/ACT inhaler Inhale 2 puffs into the lungs every 6 (six) hours as needed for wheezing.  . Norgestimate-Ethinyl Estradiol Triphasic (ORTHO TRI-CYCLEN, 28,) 0.18/0.215/0.25 MG-35 MCG tablet Take 1 tablet by mouth daily.  . [DISCONTINUED] dexlansoprazole (DEXILANT) 60 MG capsule Take 1 capsule (60 mg total) by mouth daily.   (on Yaz currently, just recently starting new pack).  No Known Allergies  ROS:  Denies fevers, chills.  No ongoing nausea/vomiting.  +GI complaints as per HPI.  No diarrhea.  +anxiety.  No URI symptoms, chest pain, shortness of breath, bleeding, bruising,  rash or other concern. No weight changes (kept off the 10 pounds that she lost last year).  PHYSICAL EXAM: BP 102/64  Pulse 68  Ht 5\' 8"  (1.727 m)  Wt 137 lb (62.143 kg)  BMI 20.84 kg/m2  LMP 02/17/2014  Well developed, pleasant female.  Only mildly anxious. Neck: no lymphadenopathy, thyromegaly or mass Heart: regular rate and rhythm without murmur Lungs: clear bilaterally Abdomen: very mild epigastric tenderness.  No rebound, guarding or organomegaly Extremities: no edema, 2+ pulse Skin: no rashes/lesions Psych: normal mood, affect, hygiene and  grooming  ASSESSMENT/PLAN:  Dysmenorrhea - well controlled with Yaz.  Trial of Ortho-Tri-Cyclen once she finishes current pill pack.  Simply for cost--go back to Yaz if doesn't do as well on OTC - Plan: Norgestimate-Ethinyl Estradiol Triphasic (ORTHO TRI-CYCLEN, 28,) 0.18/0.215/0.25 MG-35 MCG tablet  PMDD (premenstrual dysphoric disorder) - controlled on Yaz.  Trial of OTC for cost purposes - Plan: Norgestimate-Ethinyl Estradiol Triphasic (ORTHO TRI-CYCLEN, 28,) 0.18/0.215/0.25 MG-35 MCG tablet  Gastroesophageal reflux disease without esophagitis - continue Nexium and reflux precautions  Anxiety state, unspecified - encouraged counseling rather than medications at this time. Return to discuss meds if worsening symptoms.  GERD-continue nexium Discussed gas producing foods, to avoid; discussed Beano vs gas-X  Anxiety--encouraged counseling rather than meds at this point. Discussed some stress reduction and relaxation techniques.  Change to OTC with next pill pack.  If doesn't tolerate as well, then call to have Yaz refilled until December appt.   F/u as scheduled for CPE in December

## 2014-03-12 ENCOUNTER — Telehealth: Payer: Self-pay | Admitting: Family Medicine

## 2014-03-12 DIAGNOSIS — N946 Dysmenorrhea, unspecified: Secondary | ICD-10-CM

## 2014-03-12 DIAGNOSIS — F3281 Premenstrual dysphoric disorder: Secondary | ICD-10-CM

## 2014-03-12 NOTE — Telephone Encounter (Signed)
Please call  Patient left new birth control Rx at home and she has moved to college Can we send Rx to new pharmacy  [facility]  [address] [place]  ( changed store in chart)

## 2014-03-13 ENCOUNTER — Other Ambulatory Visit: Payer: Self-pay | Admitting: *Deleted

## 2014-03-13 ENCOUNTER — Other Ambulatory Visit: Payer: Self-pay | Admitting: Family Medicine

## 2014-03-13 DIAGNOSIS — F3281 Premenstrual dysphoric disorder: Secondary | ICD-10-CM

## 2014-03-13 DIAGNOSIS — N946 Dysmenorrhea, unspecified: Secondary | ICD-10-CM

## 2014-03-13 MED ORDER — NORGESTIM-ETH ESTRAD TRIPHASIC 0.18/0.215/0.25 MG-35 MCG PO TABS: 1.0000 | ORAL_TABLET | Freq: Every day | ORAL | Status: DC

## 2014-03-13 NOTE — Telephone Encounter (Signed)
Left message for patient to return my call.

## 2014-03-13 NOTE — Telephone Encounter (Signed)
Is this okay?

## 2014-03-13 NOTE — Telephone Encounter (Signed)
Could not reach patient did not want her to go without medication so I left detailed message for her and did send a new rx to pharmacy just in case. Instructed patient to call me if there are any questions.

## 2014-03-13 NOTE — Telephone Encounter (Signed)
She should be able to transfer prescriptions (both were [facility] pharmacies).  If she left the actual pack she was using, insurance might not cover (because too soon to fill), and she needs to start at the same point in the pill pack.

## 2014-04-14 ENCOUNTER — Other Ambulatory Visit: Payer: Self-pay | Admitting: Family Medicine

## 2014-04-15 ENCOUNTER — Telehealth: Payer: Self-pay | Admitting: Family Medicine

## 2014-04-15 NOTE — Telephone Encounter (Signed)
Is this okay to send in? 

## 2014-04-15 NOTE — Telephone Encounter (Signed)
It was done locally at her visit with refills, so that it should last until her December visit.  They should have transferred rx to the [facility] in [place].  It doesn't appear this was done.  They can either transfer, or approve to last until December visit

## 2014-04-15 NOTE — Telephone Encounter (Signed)
Called [facility] in [place] to see if they got the rx from July with 3 refills to last pt til November and they did not have that on file, they only had the one from august that was done in [place]. i sent in #28 with 2 refills to get her to December. But If the [facility] has rx on file and pt needs to pick it up at another [facility] then they can fill it at any [facility]

## 2014-04-15 NOTE — Telephone Encounter (Signed)
Needs refill on bcp   [facility] in [place], [place]

## 2014-04-15 NOTE — Telephone Encounter (Signed)
Duplicate request/already handled

## 2014-07-04 ENCOUNTER — Telehealth: Payer: Self-pay | Admitting: Family Medicine

## 2014-07-04 NOTE — Telephone Encounter (Signed)
Nothing special for [place].  She has visit here on 12/17 and we can discuss further then

## 2014-07-04 NOTE — Telephone Encounter (Signed)
Dad called and states Meagan Miller is going abroad on Jan 19th to [place].  Is there anything she needs to do.  Please advise [phone] .  HIPPA shows dad.

## 2014-07-04 NOTE — Telephone Encounter (Signed)
Patients father informed.

## 2014-07-11 ENCOUNTER — Ambulatory Visit (INDEPENDENT_AMBULATORY_CARE_PROVIDER_SITE_OTHER): Payer: No Typology Code available for payment source | Admitting: Family Medicine

## 2014-07-11 ENCOUNTER — Encounter: Payer: Self-pay | Admitting: Family Medicine

## 2014-07-11 VITALS — BP 100/62 | HR 68 | Ht 68.0 in | Wt 142.0 lb

## 2014-07-11 DIAGNOSIS — Z202 Contact with and (suspected) exposure to infections with a predominantly sexual mode of transmission: Secondary | ICD-10-CM

## 2014-07-11 DIAGNOSIS — F909 Attention-deficit hyperactivity disorder, unspecified type: Secondary | ICD-10-CM

## 2014-07-11 DIAGNOSIS — F411 Generalized anxiety disorder: Secondary | ICD-10-CM

## 2014-07-11 DIAGNOSIS — N943 Premenstrual tension syndrome: Secondary | ICD-10-CM

## 2014-07-11 DIAGNOSIS — N946 Dysmenorrhea, unspecified: Secondary | ICD-10-CM

## 2014-07-11 DIAGNOSIS — F988 Other specified behavioral and emotional disorders with onset usually occurring in childhood and adolescence: Secondary | ICD-10-CM

## 2014-07-11 DIAGNOSIS — F3281 Premenstrual dysphoric disorder: Secondary | ICD-10-CM

## 2014-07-11 DIAGNOSIS — Z0000 Encounter for general adult medical examination without abnormal findings: Secondary | ICD-10-CM

## 2014-07-11 DIAGNOSIS — R5383 Other fatigue: Secondary | ICD-10-CM

## 2014-07-11 DIAGNOSIS — L700 Acne vulgaris: Secondary | ICD-10-CM

## 2014-07-11 DIAGNOSIS — Z13220 Encounter for screening for lipoid disorders: Secondary | ICD-10-CM

## 2014-07-11 DIAGNOSIS — Z23 Encounter for immunization: Secondary | ICD-10-CM

## 2014-07-11 LAB — POCT URINALYSIS DIPSTICK
Bilirubin, UA: NEGATIVE
Blood, UA: NEGATIVE
Glucose, UA: NEGATIVE
Ketones, UA: NEGATIVE
Leukocytes, UA: NEGATIVE
NITRITE UA: NEGATIVE
PH UA: 6
Protein, UA: NEGATIVE
UROBILINOGEN UA: NEGATIVE

## 2014-07-11 MED ORDER — DROSPIRENONE-ETHINYL ESTRADIOL 3-0.02 MG PO TABS: 1.0000 | ORAL_TABLET | Freq: Every day | ORAL | Status: DC

## 2014-07-11 NOTE — Progress Notes (Signed)
Chief Complaint  Patient presents with  . Annual Exam    nonfasting annual exam. Would like to change ocp's-having more breakouts on her face and back. Did not do eye exam as she had one a month ago.    Meagan Miller is a 18 y.o. female who presents for a complete physical.  She has the following concerns:  She sprained her right ankle at a trampoline park on 11/19.  She just got out of the boot, and into a brace.  She is seeing Dr. Farris Has, and starts PT tomorrow. This has limited her ability to exercise (and drive). Denies significant pain.  She is a sophomore at [employer].   She has a job, working as a Dispensing optician, so less time to exercise. She is going to [place] for a semester, leaving 1/19.  She will be in [place] for 4 months. She has a form to be filled out.  Anxiety is improved.She hasn't been taking any ADD medications, and hasn't had problems.  She got 4A's and an A-  Exercise-induced asthma--She used inhaler before some practices/games in soccer, preventatively, this fall.  Hasn't needed to use it as rescue inhaler much at all.  She has a new inhaler, refilled recently after URI where she was wheezing.  Dysmenorrhea and PMDD--has been on OCP's and periods are lighter, shorter, less painful, and she is less emotional. She denies side effects.  She thinks that the Yaz was more helpful with acne compared to her current pills.  Since being on current OCP's she has had more breakouts on her face and back.  She was changed from Yaz due to cost, and would like to go back (she was taking [place]).  She was in a sexual relationship with one partner, using condoms.  Relationship ended over the summer.  She lives in a townhome; not on meal plan.  She was eating better at the beginning of the year, but now admits that her diet could be better. She eats yogurt or cereal for breakfast.   Lunches--PB&J, yogurt or cheese stick Dinner--eats a lot of pasta. She eats a lot of beans. Doesn't eat  much meat--scared to cook her own meat.  Immunization History  Administered Date(s) Administered  . DTaP 10/04/1994, 11/18/1994, 01/31/1995, 11/03/1995, 12/10/1999  . HPV Quadrivalent 04/27/2006, 06/29/2006, 12/27/2007  . Hepatitis A 04/26/2005, 04/27/2006  . Hepatitis B 03-Jan-1994, 08/25/1994, 04/25/1995  . HiB (PRP-OMP) 10/04/1994, 11/18/1994, 01/31/1995, 11/03/1995  . IPV 10/04/1994, 11/18/1994, 01/31/1995, 12/10/1999  . Influenza Split 08/04/2009, 09/06/2011, 04/24/2012  . Influenza Whole 05/19/2004, 04/26/2005, 04/27/2006  . MMR 07/25/1995, 12/10/1999  . Meningococcal Conjugate 12/27/2007, 09/06/2011  . Tdap 04/26/2005, 09/09/2011  . Varicella 07/25/1995, 12/27/2007   Last Pap smear: never Dentist: twice yearly. Ophtho: never (vision checked at school recently and was fine) Exercise:  Limited by recent ankle sprain.  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  Some running with roommates.  Past Medical History  Diagnosis Date  . Depression   . Generalized anxiety disorder     previously treated with zoloft (Dr. Dub Mikes, Dr. Hal Hope)  . ADHD (attention deficit hyperactivity disorder) 9th grade  . Asthma     Past Surgical History  Procedure Laterality Date  . Wisdom tooth extraction      History   Social History  . Marital Status: Single    Spouse Name: N/A    Number of Children: N/A  . Years of Education: N/A   Occupational History  .  student    Social History Main Topics  . Smoking status: Never Smoker   . Smokeless tobacco: Never Used  . Alcohol Use: No     Comment: occasional  . Drug Use: No  . Sexual Activity:    Partners: Male    Birth Control/ Protection: Condom, Pill   Other Topics Concern  . Not on file   Social History Narrative   Graduated from [employer]. Going to [employer] (sophomore). Lives with parents, sister. At school, she lives in a townhouse.    Family History  Problem Relation Age of Onset  .  Asthma Mother   . Anxiety disorder Mother   . Cancer Father 13    colon  . Asthma Sister   . Anxiety disorder Sister   . Cancer Maternal Aunt     great aunt--ovarian cancer  . Anxiety disorder Maternal Aunt   . Anxiety disorder Maternal Uncle   . Anxiety disorder Maternal Grandmother   . Hypertension Maternal Grandmother   . Cancer Cousin     breast cancer (1st cousins once removed)--late 42's for 2 cousins  . Breast cancer Cousin   . Stroke Cousin   . Diabetes Neg Hx     Outpatient Encounter Prescriptions as of 07/11/2014  Medication Sig Note  . albuterol (PROVENTIL HFA;VENTOLIN HFA) 108 (90 BASE) MCG/ACT inhaler Inhale 2 puffs into the lungs every 6 (six) hours as needed for wheezing. 07/11/2014: Uses infrequently (prior to exercise, and with illnesses)  . [DISCONTINUED] TRI-SPRINTEC 0.18/0.215/0.25 MG-35 MCG tablet TAKE 1 TABLET BY MOUTH DAILY.   . drospirenone-ethinyl estradiol (GIANVI) 3-0.02 MG tablet Take 1 tablet by mouth daily.   . [DISCONTINUED] esomeprazole (NEXIUM) 20 MG capsule Take 20 mg by mouth daily at 12 noon.   Was taking Tri-sprintec prior to todays' visit (not Gianvi)  No Known Allergies  ROS:  No fevers, URI symptoms, headaches, chest pain, palpitations, nausea, vomiting, bowel changes, vaginal discharge, urinary complaints, joint pains, bleeding/bruising, skin lesions/rashes (just mild acne), cough, shortness of breath. Denies depression. Anxiety is improved.Burping hasn't been much of an issue--still does it some.  Doesn't bother her at all, just her parents.  Less than she used to (possibly related to no longer drinking any soda).  Vaginal discharge has been thinner/slightly more watery than usual.  No odor, itch, discomfort. She had missed a couple of days of pills--had some spotting and period lasted longer. She is not currently sexually active Denies depression, insomnia.   Regained 5 pounds. See HPI  PHYSICAL EXAM:  BP 100/62 mmHg  Pulse 68  Ht  _0  (1.727 m)  Wt 142 lb (64.411 kg)  BMI 21.60 kg/m2  LMP 07/04/2014  Pleasant, thin female in no distress HEENT: PERRL, EOMI, conjunctiva clear. Fundi benign. TM's and EAC's normal, OP clear. Nasal mucosa is normal. Neck: no lymphadenopathy, thyromegaly or mass Heart: regular rate and rhythm without murmur Lungs: clear bilaterally Breasts: No nipple discharge, inversion, dominant masses or axillary lymphadenopathy Abdomen: soft, nontender, no mass Extremities: no edema, 2+ pulse Back: no CVA tenderness, spinal tenderness or scoliosis Psych: normal mood, affect, hygiene and grooming Neuro: alert and oriented. Cranial nerves intact. Normal strength, sensation, DTR's. 2+ and symmetric Skin: some healing acne on upper back,no scarring. Pelvic exam:  deferred  ASSESSMENT/PLAN:  Annual physical exam - Plan: Lipid panel, HIV antibody, GC/Chlamydia Probe Amp, POCT Urinalysis Dipstick  Need for prophylactic vaccination and inoculation against influenza - Plan: Flu Vaccine QUAD 36+ mos PF IM (Fluarix Quad PF)  Dysmenorrhea - improved on OCP's, continue - Plan: drospirenone-ethinyl estradiol (GIANVI) 3-0.02 MG tablet  Other fatigue - Plan: CBC with Differential, Vit D  25 hydroxy (rtn osteoporosis monitoring), TSH  Possible exposure to STD - Plan: HIV antibody, GC/Chlamydia Probe Amp  Screening for lipid disorders - Plan: Lipid panel  PMDD (premenstrual dysphoric disorder) - improved. continue OCPs  ADD (attention deficit disorder) - no longer on medication or under care of psych.  Doing well in school without meds  Anxiety state - improved overall.  encouraged stress reduction techniques, regular exercise  Acne vulgaris - previously better controlled with prior OCP (Gianvi)--switch back  Counseled re: diet (discussed high protein foods, adequate fruits/vegetables and calcium intake).  Discussed exercise. Safe sex practices. Handout given with info on safe driving, seatbelt  use, sunscreen, etc.  Reassured re: vaginal discharge.  Return for eval if ongoing/worsening discharge, pelvic pain  Chlamydia screen on urine today, as well as some screening labs including: Lipid, CBC, Vit D, HIV, TSH   Form filled out.  F/u 1 year, sooner prn

## 2014-07-11 NOTE — Patient Instructions (Signed)
  HEALTH MAINTENANCE RECOMMENDATIONS:  It is recommended that you get at least 30 minutes of aerobic exercise at least 5 days/week (for weight loss, you may need as much as 60-90 minutes). This can be any activity that gets your heart rate up. This can be divided in 10-15 minute intervals if needed, but try and build up your endurance at least once a week.  Weight bearing exercise is also recommended twice weekly.  Eat a healthy diet with lots of vegetables, fruits and fiber.  "Colorful" foods have a lot of vitamins (ie green vegetables, tomatoes, red peppers, etc).  Limit sweet tea, regular sodas and alcoholic beverages, all of which has a lot of calories and sugar. Drink a lot of water.  Calcium recommendations are 1200-1500 mg daily (1500 mg for postmenopausal women or women without ovaries), and vitamin D 1000 IU daily.  This should be obtained from diet and/or supplements (vitamins), and calcium should not be taken all at once, but in divided doses.  Monthly self breast exams and yearly mammograms for women over the age of 40 is recommended.  Sunscreen of at least SPF 30 should be used on all sun-exposed parts of the skin when outside between the hours of 10 am and 4 pm (not just when at beach or pool, but even with exercise, golf, tennis, and yard work!)  Use a sunscreen that says "broad spectrum" so it covers both UVA and UVB rays, and make sure to reapply every 1-2 hours.  Remember to change the batteries in your smoke detectors when changing your clock times in the spring and fall.  Use your seat belt every time you are in a car, and please drive safely and not be distracted with cell phones and texting while driving.    

## 2014-07-12 DIAGNOSIS — Z23 Encounter for immunization: Secondary | ICD-10-CM

## 2014-07-12 LAB — CBC WITH DIFFERENTIAL/PLATELET
Basophils Absolute: 0 10*3/uL (ref 0.0–0.1)
Basophils Relative: 0 % (ref 0–1)
Eosinophils Absolute: 0.1 10*3/uL (ref 0.0–0.7)
Eosinophils Relative: 2 % (ref 0–5)
HEMATOCRIT: 40.2 % (ref 36.0–46.0)
Hemoglobin: 13.9 g/dL (ref 12.0–15.0)
LYMPHS ABS: 1.8 10*3/uL (ref 0.7–4.0)
Lymphocytes Relative: 24 % (ref 12–46)
MCH: 28.7 pg (ref 26.0–34.0)
MCHC: 34.6 g/dL (ref 30.0–36.0)
MCV: 83.1 fL (ref 78.0–100.0)
MONOS PCT: 9 % (ref 3–12)
MPV: 9.3 fL — ABNORMAL LOW (ref 9.4–12.4)
Monocytes Absolute: 0.7 10*3/uL (ref 0.1–1.0)
NEUTROS ABS: 4.7 10*3/uL (ref 1.7–7.7)
NEUTROS PCT: 65 % (ref 43–77)
Platelets: 354 10*3/uL (ref 150–400)
RBC: 4.84 MIL/uL (ref 3.87–5.11)
RDW: 14 % (ref 11.5–15.5)
WBC: 7.3 10*3/uL (ref 4.0–10.5)

## 2014-07-12 LAB — GC/CHLAMYDIA PROBE AMP
CT PROBE, AMP APTIMA: NEGATIVE
GC PROBE AMP APTIMA: NEGATIVE

## 2014-07-12 LAB — HIV ANTIBODY (ROUTINE TESTING W REFLEX): HIV 1&2 Ab, 4th Generation: NONREACTIVE

## 2014-07-12 LAB — TSH: TSH: 0.867 u[IU]/mL (ref 0.350–4.500)

## 2014-07-12 LAB — LIPID PANEL
Cholesterol: 202 mg/dL — ABNORMAL HIGH (ref 0–200)
HDL: 68 mg/dL (ref >39)
LDL Cholesterol: 104 mg/dL — ABNORMAL HIGH (ref 0–99)
Total CHOL/HDL Ratio: 3 Ratio
Triglycerides: 151 mg/dL — ABNORMAL HIGH (ref <150)
VLDL: 30 mg/dL (ref 0–40)

## 2014-07-12 LAB — VITAMIN D 25 HYDROXY (VIT D DEFICIENCY, FRACTURES): VIT D 25 HYDROXY: 37 ng/mL (ref 30–100)

## 2014-08-13 ENCOUNTER — Other Ambulatory Visit: Payer: Self-pay

## 2014-08-13 ENCOUNTER — Telehealth: Payer: Self-pay | Admitting: Family Medicine

## 2014-08-13 MED ORDER — DROSPIRENONE-ETHINYL ESTRADIOL 3-0.03 MG PO TABS: 1.0000 | ORAL_TABLET | Freq: Every day | ORAL | Status: DC

## 2014-08-13 NOTE — Telephone Encounter (Signed)
Patient called and said she was going out of town and her ins. Will not cover the yaz but will cover yasmin  So with Dr.Lalonde okay i called her in 1 pack of yasmin

## 2014-08-13 NOTE — Telephone Encounter (Signed)
I assume that you took care of this already

## 2014-08-13 NOTE — Telephone Encounter (Signed)
Pt is going out of the country in a few hours and they went to pick up her birth control pills (Yaz) and were told that their insurance will no longer cover Yaz they will cover Yazmin. Dianah FieldYaz works for pt so if they want this they will have to pay entire cost of pocket which is $200 of get new script for Owens [employer] MinorYazmin. Decision need to made in an hour or so before pt leaves.  Is Yazmin the sam as Yaz?

## 2014-08-13 NOTE — Telephone Encounter (Signed)
done

## 2014-10-25 ENCOUNTER — Other Ambulatory Visit: Payer: Self-pay

## 2014-10-25 ENCOUNTER — Telehealth: Payer: Self-pay | Admitting: Family Medicine

## 2014-10-25 MED ORDER — DROSPIRENONE-ETHINYL ESTRADIOL 3-0.03 MG PO TABS: 1.0000 | ORAL_TABLET | Freq: Every day | ORAL | Status: DC

## 2014-10-25 NOTE — Telephone Encounter (Signed)
Dr.lalonde okayed this

## 2014-10-25 NOTE — Telephone Encounter (Signed)
Pt is out of the country in [place]. Requesting 3 packs of refills on Yaz birth control so that father can mail these to her today. Please call father, Nelma RothmanDaryl, at [phone] when script sent to pharmacy so that he can go pick them up.

## 2014-10-25 NOTE — Telephone Encounter (Signed)
Left message sent in yaz per jcl

## 2014-10-25 NOTE — Telephone Encounter (Signed)
Work with the pharmacy to do this.

## 2014-11-12 ENCOUNTER — Telehealth: Payer: Self-pay | Admitting: Family Medicine

## 2014-11-12 NOTE — Telephone Encounter (Signed)
Called [facility] t# [phone] regarding P.A. FOR Meagan Miller (generic Yaz)  Per insurance company they will pay for brand name Yasmin 28's, if have any problems have pharmacy use NDC# [id number].  Called pharmacy & pt had picked up & paid out of pocket $153.  Called pt & # not working.  Called mom Meagan Miller who is on pt's HIPPA, she states pt in [place].  Advised mom to ask for Brand next time & should be free with insurance.

## 2015-01-13 ENCOUNTER — Telehealth: Payer: Self-pay | Admitting: Family Medicine

## 2015-01-13 NOTE — Telephone Encounter (Signed)
Mom called & states pt suffering with anxiety & depression this weekend & needed to be seen today before she goes back to school in the morning. All providers are booked so I offered appt in the morning but she states she goes to school out of town & doesn't have a car is riding with someone so she can't postpone leaving.  I suggested going to Urgent Care or Behavioral Health. She will take her to Urgent Care & try to get her some help.

## 2015-01-15 ENCOUNTER — Telehealth: Payer: Self-pay | Admitting: Family Medicine

## 2015-01-15 NOTE — Telephone Encounter (Signed)
Mother reports that she has had no energy, wants to sleep a lot (but has been traveling, [place]);  Transfering to [employer] from [employer] (after not having gotten in to [employer] for 2 years)--she is stressed about this, and doing well there. Mom thinks ADHD impacts her, contributes to feeling frustrated. She is in [place] for the summer, moving to [place] in August. She will be in [place] x 7 weeks.  Mom states she and pt's sister also have issues with anxiety, haven't responded to many meds, but both are doing well on prozac.  We discussed the recommendation for counseling (can try and start while in [place], definitely to get while in school); consider making OV here prior to starting school to discuss possibly starting medication (ie Prozac), especially if therapist suggests medication.  Chart reviewed--labs were all done 6 months ago to evaluate fatigue, and was normal (mother was asking to be checked for anemia).  15 minute phone call with mother. All questions answered.

## 2015-02-12 ENCOUNTER — Encounter: Payer: Self-pay | Admitting: Family Medicine

## 2015-02-12 ENCOUNTER — Ambulatory Visit (INDEPENDENT_AMBULATORY_CARE_PROVIDER_SITE_OTHER): Payer: No Typology Code available for payment source | Admitting: Family Medicine

## 2015-02-12 VITALS — BP 108/68 | HR 84 | Ht 68.0 in | Wt 153.2 lb

## 2015-02-12 DIAGNOSIS — S93401S Sprain of unspecified ligament of right ankle, sequela: Secondary | ICD-10-CM

## 2015-02-12 DIAGNOSIS — R5383 Other fatigue: Secondary | ICD-10-CM | POA: Diagnosis not present

## 2015-02-12 DIAGNOSIS — Z3041 Encounter for surveillance of contraceptive pills: Secondary | ICD-10-CM | POA: Diagnosis not present

## 2015-02-12 DIAGNOSIS — F411 Generalized anxiety disorder: Secondary | ICD-10-CM | POA: Diagnosis not present

## 2015-02-12 MED ORDER — YAZ 3-0.02 MG PO TABS: 1.0000 | ORAL_TABLET | Freq: Every day | ORAL | Status: DC

## 2015-02-12 NOTE — Patient Instructions (Addendum)
Acute Ankle Sprain with Phase I Rehab An acute ankle sprain is a partial or complete tear in one or more of the ligaments of the ankle due to traumatic injury. The severity of the injury depends on both the number of ligaments sprained and the grade of sprain. There are 3 grades of sprains.   A grade 1 sprain is a mild sprain. There is a slight pull without obvious tearing. There is no loss of strength, and the muscle and ligament are the correct length.  A grade 2 sprain is a moderate sprain. There is tearing of fibers within the substance of the ligament where it connects two bones or two cartilages. The length of the ligament is increased, and there is usually decreased strength.  A grade 3 sprain is a complete rupture of the ligament and is uncommon. In addition to the grade of sprain, there are three types of ankle sprains.  Lateral ankle sprains: This is a sprain of one or more of the three ligaments on the outer side (lateral) of the ankle. These are the most common sprains. Medial ankle sprains: There is one large triangular ligament of the inner side (medial) of the ankle that is susceptible to injury. Medial ankle sprains are less common. Syndesmosis, "high ankle," sprains: The syndesmosis is the ligament that connects the two bones of the lower leg. Syndesmosis sprains usually only occur with very severe ankle sprains. SYMPTOMS  Pain, tenderness, and swelling in the ankle, starting at the side of injury that may progress to the whole ankle and foot with time.  "Pop" or tearing sensation at the time of injury.  Bruising that may spread to the heel.  Impaired ability to walk soon after injury. CAUSES   Acute ankle sprains are caused by trauma placed on the ankle that temporarily forces or pries the anklebone (talus) out of its normal socket.  Stretching or tearing of the ligaments that normally hold the joint in place (usually due to a twisting injury). RISK INCREASES  WITH:  Previous ankle sprain.  Sports in which the foot may land awkwardly (i.e., basketball, volleyball, or soccer) or walking or running on uneven or rough surfaces.  Shoes with inadequate support to prevent sideways motion when stress occurs.  Poor strength and flexibility.  Poor balance skills.  Contact sports. PREVENTION   Warm up and stretch properly before activity.  Maintain physical fitness:  Ankle and leg flexibility, muscle strength, and endurance.  Cardiovascular fitness.  Balance training activities.  Use proper technique and have a coach correct improper technique.  Taping, protective strapping, bracing, or high-top tennis shoes may help prevent injury. Initially, tape is best; however, it loses most of its support function within 10 to 15 minutes.  Wear proper-fitted protective shoes (High-top shoes with taping or bracing is more effective than either alone).  Provide the ankle with support during sports and practice activities for 12 months following injury. PROGNOSIS   If treated properly, ankle sprains can be expected to recover completely; however, the length of recovery depends on the degree of injury.  A grade 1 sprain usually heals enough in 5 to 7 days to allow modified activity and requires an average of 6 weeks to heal completely.  A grade 2 sprain requires 6 to 10 weeks to heal completely.  A grade 3 sprain requires 12 to 16 weeks to heal.  A syndesmosis sprain often takes more than 3 months to heal. RELATED COMPLICATIONS   Frequent recurrence of symptoms may   result in a chronic problem. Appropriately addressing the problem the first time decreases the frequency of recurrence and optimizes healing time. Severity of the initial sprain does not predict the likelihood of later instability.  Injury to other structures (bone, cartilage, or tendon).  A chronically unstable or arthritic ankle joint is a possibility with repeated  sprains. TREATMENT Treatment initially involves the use of ice, medication, and compression bandages to help reduce pain and inflammation. Ankle sprains are usually immobilized in a walking cast or boot to allow for healing. Crutches may be recommended to reduce pressure on the injury. After immobilization, strengthening and stretching exercises may be necessary to regain strength and a full range of motion. Surgery is rarely needed to treat ankle sprains. MEDICATION   Nonsteroidal anti-inflammatory medications, such as aspirin and ibuprofen (do not take for the first 3 days after injury or within 7 days before surgery), or other minor pain relievers, such as acetaminophen, are often recommended. Take these as directed by your caregiver. Contact your caregiver immediately if any bleeding, stomach upset, or signs of an allergic reaction occur from these medications.  Ointments applied to the skin may be helpful.  Pain relievers may be prescribed as necessary by your caregiver. Do not take prescription pain medication for longer than 4 to 7 days. Use only as directed and only as much as you need. HEAT AND COLD  Cold treatment (icing) is used to relieve pain and reduce inflammation for acute and chronic cases. Cold should be applied for 10 to 15 minutes every 2 to 3 hours for inflammation and pain and immediately after any activity that aggravates your symptoms. Use ice packs or an ice massage.  Heat treatment may be used before performing stretching and strengthening activities prescribed by your caregiver. Use a heat pack or a warm soak. SEEK IMMEDIATE MEDICAL CARE IF:   Pain, swelling, or bruising worsens despite treatment.  You experience pain, numbness, discoloration, or coldness in the foot or toes.  New, unexplained symptoms develop (drugs used in treatment may produce side effects.) EXERCISES  PHASE I EXERCISES RANGE OF MOTION (ROM) AND STRETCHING EXERCISES - Ankle Sprain, Acute Phase I,  Weeks 1 to 2 These exercises may help you when beginning to restore flexibility in your ankle. You will likely work on these exercises for the 1 to 2 weeks after your injury. Once your physician, physical therapist, or athletic trainer sees adequate progress, he or she will advance your exercises. While completing these exercises, remember:   Restoring tissue flexibility helps normal motion to return to the joints. This allows healthier, less painful movement and activity.  An effective stretch should be held for at least 30 seconds.  A stretch should never be painful. You should only feel a gentle lengthening or release in the stretched tissue. RANGE OF MOTION - Dorsi/Plantar Flexion  While sitting with your right / left knee straight, draw the top of your foot upwards by flexing your ankle. Then reverse the motion, pointing your toes downward.  Hold each position for __________ seconds.  After completing your first set of exercises, repeat this exercise with your knee bent. Repeat __________ times. Complete this exercise __________ times per day.  RANGE OF MOTION - Ankle Alphabet  Imagine your right / left big toe is a pen.  Keeping your hip and knee still, write out the entire alphabet with your "pen." Make the letters as large as you can without increasing any discomfort. Repeat __________ times. Complete this exercise __________   times per day.  STRENGTHENING EXERCISES - Ankle Sprain, Acute -Phase I, Weeks 1 to 2 These exercises may help you when beginning to restore strength in your ankle. You will likely work on these exercises for 1 to 2 weeks after your injury. Once your physician, physical therapist, or athletic trainer sees adequate progress, he or she will advance your exercises. While completing these exercises, remember:   Muscles can gain both the endurance and the strength needed for everyday activities through controlled exercises.  Complete these exercises as instructed by  your physician, physical therapist, or athletic trainer. Progress the resistance and repetitions only as guided.  You may experience muscle soreness or fatigue, but the pain or discomfort you are trying to eliminate should never worsen during these exercises. If this pain does worsen, stop and make certain you are following the directions exactly. If the pain is still present after adjustments, discontinue the exercise until you can discuss the trouble with your clinician. STRENGTH - Dorsiflexors  Secure a rubber exercise band/tubing to a fixed object (i.e., table, pole) and loop the other end around your right / left foot.  Sit on the floor facing the fixed object. The band/tubing should be slightly tense when your foot is relaxed.  Slowly draw your foot back toward you using your ankle and toes.  Hold this position for __________ seconds. Slowly release the tension in the band and return your foot to the starting position. Repeat __________ times. Complete this exercise __________ times per day.  STRENGTH - Plantar-flexors   Sit with your right / left leg extended. Holding onto both ends of a rubber exercise band/tubing, loop it around the ball of your foot. Keep a slight tension in the band.  Slowly push your toes away from you, pointing them downward.  Hold this position for __________ seconds. Return slowly, controlling the tension in the band/tubing. Repeat __________ times. Complete this exercise __________ times per day.  STRENGTH - Ankle Eversion  Secure one end of a rubber exercise band/tubing to a fixed object (table, pole). Loop the other end around your foot just before your toes.  Place your fists between your knees. This will focus your strengthening at your ankle.  Drawing the band/tubing across your opposite foot, slowly, pull your little toe out and up. Make sure the band/tubing is positioned to resist the entire motion.  Hold this position for __________ seconds. Have  your muscles resist the band/tubing as it slowly pulls your foot back to the starting position.  Repeat __________ times. Complete this exercise __________ times per day.  STRENGTH - Ankle Inversion  Secure one end of a rubber exercise band/tubing to a fixed object (table, pole). Loop the other end around your foot just before your toes.  Place your fists between your knees. This will focus your strengthening at your ankle.  Slowly, pull your big toe up and in, making sure the band/tubing is positioned to resist the entire motion.  Hold this position for __________ seconds.  Have your muscles resist the band/tubing as it slowly pulls your foot back to the starting position. Repeat __________ times. Complete this exercises __________ times per day.  STRENGTH - Towel Curls  Sit in a chair positioned on a non-carpeted surface.  Place your right / left foot on a towel, keeping your heel on the floor.  Pull the towel toward your heel by only curling your toes. Keep your heel on the floor.  If instructed by your physician, physical therapist,   or athletic trainer, add weight to the end of the towel. Repeat __________ times. Complete this exercise __________ times per day. Document Released: 02/10/2005 Document Revised: 11/26/2013 Document Reviewed: 10/24/2008 [facility] Patient Information 2015 [place], [place]. This information is not intended to replace advice given to you by your health care provider. Make sure you discuss any questions you have with your health care provider.  EXERCISES  PHASE II EXERCISES RANGE OF MOTION (ROM) AND STRETCHING EXERCISES - Ankle Sprain, Acute-Phase II, Weeks 3 to 4 After your physician, physical therapist, or athletic trainer feels your knee has made progress significant enough to begin more advanced exercises, he or she may recommend completing some of the following exercises. Although each person heals at different rates, most people will be ready for these  exercises between 3 and 4 weeks after their injury. Do not begin these exercises until you have your caregiver's permission. He or she may also advise you to continue with the exercises which you completed in Phase I of your rehabilitation. While completing these exercises, remember:   Restoring tissue flexibility helps normal motion to return to the joints. This allows healthier, less painful movement and activity.  An effective stretch should be held for at least 30 seconds.  A stretch should never be painful. You should only feel a gentle lengthening or release in the stretched tissue. RANGE OF MOTION - Ankle Plantar Flexion   Sit with your right / left leg crossed over your opposite knee.  Use your opposite hand to pull the top of your foot and toes toward you.  You should feel a gentle stretch on the top of your foot/ankle. Hold this position for __________. Repeat __________ times. Complete __________ times per day.  RANGE OF MOTION - Ankle Eversion  Sit with your right / left ankle crossed over your opposite knee.  Grip your foot with your opposite hand, placing your thumb on the top of your foot and your fingers across the bottom of your foot.  Gently push your foot downward with a slight rotation so your littlest toes rise slightly  You should feel a gentle stretch on the inside of your ankle. Hold the stretch for __________ seconds. Repeat __________ times. Complete this exercise __________ times per day.  RANGE OF MOTION - Ankle Inversion  Sit with your right / left ankle crossed over your opposite knee.  Grip your foot with your opposite hand, placing your thumb on the bottom of your foot and your fingers across the top of your foot.  Gently pull your foot so the smallest toe comes toward you and your thumb pushes the inside of the ball of your foot away from you.  You should feel a gentle stretch on the outside of your ankle. Hold the stretch for __________  seconds. Repeat __________ times. Complete this exercise __________ times per day.  STRETCH - Gastrocsoleus  Sit with your right / left leg extended. Holding onto both ends of a belt or towel, loop it around the ball of your foot.  Keeping your right / left ankle and foot relaxed and your knee straight, pull your foot and ankle toward you using the belt/towel.  You should feel a gentle stretch behind your calf or knee. Hold this position for __________ seconds. Repeat __________ times. Complete this stretch __________ times per day.  RANGE OF MOTION - Ankle Dorsiflexion, Active Assisted  Remove shoes and sit on a chair that is preferably not on a carpeted surface.  Place right /  left foot under knee. Extend your opposite leg for support.  Keeping your heel down, slide your right / left foot back toward the chair until you feel a stretch at your ankle or calf. If you do not feel a stretch, slide your bottom forward to the edge of the chair while still keeping your heel down.  Hold this stretch for __________ seconds. Repeat __________ times. Complete this stretch __________ times per day.  STRETCH - Gastroc, Standing   Place hands on wall.  Extend right / left leg and place a folded washcloth under the arch of your foot for support. Keep the front knee somewhat bent.  Slightly point your toes inward on your back foot.  Keeping your right / left heel on the floor and your knee straight, shift your weight toward the wall, not allowing your back to arch.  You should feel a gentle stretch in the calf. Hold this position for __________ seconds. Repeat __________ times. Complete this stretch __________ times per day. STRETCH - Soleus, Standing  Place hands on wall.  Extend right / left leg and place a folded washcloth under the arch of your foot for support. Keep the front knee somewhat bent.  Slightly point your toes inward on your back foot.  Keep your right / left heel on the  floor, bend your back knee, and slightly shift your weight over the back leg so that you feel a gentle stretch deep in your back calf.  Hold this position for __________ seconds. Repeat __________ times. Complete this stretch __________ times per day. STRETCH - Gastrocsoleus, Standing Note: This exercise can place a lot of stress on your foot and ankle. Please complete this exercise only if specifically instructed by your caregiver.   Place the ball of your right / left foot on a step, keeping your other foot firmly on the same step.  Hold on to the wall or a rail for balance.  Slowly lift your other foot, allowing your body weight to press your heel down over the edge of the step.  You should feel a stretch in your right / left calf.  Hold this position for __________ seconds.  Repeat this exercise with a slight bend in your knee. Repeat __________ times. Complete this stretch __________ times per day.  STRENGTHENING EXERCISES - Ankle Sprain, Acute-Phase II Around 3 to 4 weeks after your injury, you may progress to some of these exercises in your rehabilitation program. Do not begin these until you have your caregiver's permission. Although your condition has improved, the Phase I exercises will continue to be helpful and you may continue to complete them. As you complete strengthening exercises, remember:   Strong muscles with good endurance tolerate stress better.  Do the exercises as initially prescribed by your caregiver. Progress slowly with each exercise, gradually increasing the number of repetitions and weight used under his or her guidance.  You may experience muscle soreness or fatigue, but the pain or discomfort you are trying to eliminate should never worsen during these exercises. If this pain does worsen, stop and make certain you are following the directions exactly. If the pain is still present after adjustments, discontinue the exercise until you can discuss the trouble  with your caregiver. STRENGTH - Plantar-flexors, Standing  Stand with your feet shoulder width apart. Steady yourself with a wall or table using as little support as needed.  Keeping your weight evenly spread over the width of your feet, rise up on your toes.*  Hold this position for __________ seconds. Repeat __________ times. Complete this exercise __________ times per day.  *If this is too easy, shift your weight toward your right / left leg until you feel challenged. Ultimately, you may be asked to do this exercise with your right / left foot only. STRENGTH - Dorsiflexors and Plantar-flexors, Heel/toe Walking  Dorsiflexion: Walk on your heels only. Keep your toes as high as possible.  Walk for ____________________ seconds/feet.  Repeat __________ times. Complete __________ times per day.  Plantar flexion: Walk on your toes only. Keep your heels as high as possible.  Walk for ____________________ seconds/feet. Repeat __________ times. Complete __________ times per day.  BALANCE - Tandem Walking  Place your uninjured foot on a line 2 to 4 inches wide and at least 10 feet long.  Keeping your balance without using anything for extra support, place your right / left heel directly in front of your other foot.  Slowly raise your back foot up, lifting from the heel to the toes, and place it directly in front of the right / left foot.  Continue to walk along the line slowly. Walk for ____________________ feet. Repeat ____________________ times. Complete ____________________ times per day. BALANCE - Inversion/Eversion Use caution, these are advanced level exercises. Do not begin them until you are advised to do so.   Create a balance board using a sturdy board about 1  feet long and at 1 to 1  feet wide and a 1  inch diameter rod or pipe that is as long as the board's width. A copper pipe or a solid broomstick work well.  Stand on a non-carpeted surface near a countertop or wall.  Step onto the board so that your feet are hip-width apart and equally straddle the rod/pipe.  Keeping your feet in place, complete these two exercises without shifting your upper body or hips:  Tip the board from side-to-side. Control the movement so the board does not forcefully strike the ground. The board should silently tap the ground.  Tip the board side-to-side without striking the ground. Occasionally pause and maintain a steady position at various points.  Repeat the first two exercises, but use only your right / left foot. Place your right / left foot directly over the rod/pipe. Repeat __________ times. Complete this exercise __________ times a day. BALANCE - Plantar/Dorsi Flexion Use caution, these are advanced level exercises. Do not begin them until you are advised to do so.   Create a balance board using a sturdy board about 1  feet long and at 1 to 1  feet wide and a 1  inch diameter rod or pipe that is as long as the board's width. A copper pipe or a solid broomstick work well.  Stand on a non-carpeted surface near a countertop or wall. Stand on the board so that the rod/pipe runs under the arches in your feet.  Keeping your feet in place, complete these two exercises without shifting your upper body or hips:  Tip the board from side-to-side. Control the movement so the board does not forcefully strike the ground. The board should silently tap the ground.  Tip the board side-to-side without striking the ground. Occasionally pause and maintain a steady position at various points.  Repeat the first two exercises, but use only your right / left foot. Stand in the center of the board. Repeat __________ times. Complete this exercise __________ times a day. STRENGTH - Plantar-flexors, Eccentric Note: This exercise can place a lot  of stress on your foot and ankle. Please complete this exercise only if specifically instructed by your caregiver.   Place the balls of your feet on  a step. With your hands, use only enough support from a wall or rail to keep your balance.  Keep your knees straight and rise up on your toes.  Slowly shift your weight entirely to your toes and pick up your opposite foot. Gently and with controlled movement, lower your weight through your right / left foot so that your heel drops below the level of the step. You will feel a slight stretch in the back of your calf at the ending position.  Use the healthy leg to help rise up onto the balls of both feet, then lower weight only on the right / left leg again. Build up to 15 repetitions. Then progress to 3 consecutive sets of 15 repetitions.*  After completing the above exercise, complete the same exercise with a slight knee bend (about 30 degrees). Again, build up to 15 repetitions. Then progress to 3 consecutive sets of 15 repetitions.* Perform this exercise __________ times per day.  *When you easily complete 3 sets of 15, your physician, physical therapist, or athletic trainer may advise you to add resistance by wearing a backpack filled with additional weight. Document Released: 11/01/2005 Document Revised: 10/04/2011 Document Reviewed: 10/24/2008 [facility] Patient Information 2015 [place], [place]. This information is not intended to replace advice given to you by your health care provider. Make sure you discuss any questions you have with your health care provider.   All exercise should be held for 10 seconds, repeat 5-10 times, and done twice daily (fill in the blanks in the exercises above).  Please get [place] the information about a physical therapy place in [place] so that we can do the referral. In the meantime, do the exercises above.  Sign a release of records to get the urgent care records to [place] (so we can see if you had a pap or not).  I strongly encourage you to get involved in counseling through school once you get moved in (at [facility]).  They will be able to help let you know  if counseling is not enough, and if medications might be indicated.  It does not sound like you need a daily medication to prevent/treat anxiety at this point.  Make sure you are getting adequate sleep (at least 8 hours), but not more than 109. Get regular exercise, and eat well--avoid skipping meals, avoid high sugar loads (you will feel tired about an hour or two after a lot of sugar). You were evaluated in December with bloodwork--normal thyroid, Vitamin D level and no anemia.

## 2015-02-12 NOTE — Progress Notes (Signed)
Chief Complaint  Patient presents with  . Advice Only    would like to get back on OCP's-has been off about 3 weeks-did not last ones that were called in. Per patient BRAND name Meagan FieldYAZ is the cheapest.   . Ankle Injury    sprained ankle 11/15 has ankle still bothers her and would like to go to PT.    Patient needs refill on birth control pills-- She has been off x 3 weeks --ran out, didn't want to get a refill of the same one. The one sent to her in [place] looked very different (different generic for same med?), and also cost more--she reports that her insurance keeps changing what is covered, cost, etc. She believes now that the cost of Yaz brand is cheapest and is requesting.  Review of med history: gianvi 06/2014 yazmin in April 2016 Has had both yaz and yazmin in the past, and doesn't recall any significant difference between them.  Not currently in a sexual relationship. First intercourse was age 19 Had pap and STD check at urgent care, reportedly.  She reports that she had UTI in [place], not treated properly, didn't resolve so went to UC here. Doesn't recall being told of any pap results.  Second concern is that she sprained her right ankle last November when in [place]. She needs a referral to get PT. She reports that she was seen in ER in [place].She saw someone (?ortho or sports medicine) in December, treated with a brace and boot, given home exercises. It got a little better, but then she went abroad and was walking a lot. She has ongoing pain when shifting from gas to brake in the car, and has stiffness in the ankle.  She would like to start PT, but needs to wait and start when [facility].  She also wants to discuss her anxiety today.  She recalls taking meds back in HS, along with ADHD meds, but they made her sick, but wasn't sure which med made her feel bad. She reports having physical symptoms of anxiety--weight in her chest, anxious, lets things build up.  Once she was so  anxious she vomited, but that was a year ago.  Denies anxiety attacks. Sometimes has trouble sleeping, but intermittent (school-related only-- ie. projects, tests).  Sometimes she is anxious for no reason, and that feeling triggers more anxiety.  Possibly some depression recently--would rather see someone than take meds. Denies any suicidal thought.  She feels tired.  She states her mother told her to talk to me today about her fatigue.  Se did discuss this in detail at her last visit, and labs were done in December to evaluate.  Her fatigue has not worsened, is intermittent.  PMH, PSH SH and FH were reviewed.  Outpatient Encounter Prescriptions as of 02/12/2015  Medication Sig Note  . albuterol (PROVENTIL HFA;VENTOLIN HFA) 108 (90 BASE) MCG/ACT inhaler Inhale 2 puffs into the lungs every 6 (six) hours as needed for wheezing. (Patient not taking: Reported on 02/12/2015) 07/11/2014: Uses infrequently (prior to exercise, and with illnesses)  . YAZ 3-0.02 MG tablet Take 1 tablet by mouth daily.   . [DISCONTINUED] doxycycline (VIBRAMYCIN) 100 MG capsule  02/12/2015: Received from: External Pharmacy  . [DISCONTINUED] drospirenone-ethinyl estradiol (YASMIN 28) 3-0.03 MG tablet Take 1 tablet by mouth daily. (Patient not taking: Reported on 02/12/2015)    No facility-administered encounter medications on file as of 02/12/2015.   No Known Allergies  ROS:  +fatigue, anxiety as per HPI.  No headaches, dizziness, chest pain, shortness of breath, nausea, vomiting. No weight changes, normal appetite.  See HPI. No fever, chills, URI symptoms.  +ankle stiffness. +11# weight gain since December (spent semester in [place])  PHYSICAL EXAM: BP 108/68 mmHg  Pulse 84  Ht  (1.727 m)  Wt 153 lb 3.2 oz (69.491 kg)  BMI 23.30 kg/m2  LMP 01/27/2015 Well developed, pleasant female in no distress She is in good spirits, doesn't appear anxious or depressed.  Full range of affect. Normal speech, eye contact, hygiene  and grooming. Neck: no lymphadenopathy, thyromegaly or mass Heart: regular rate and rhythm Lungs: clear Extremities: right ankle-- Mild tenderness at ATFL No significant swelling. Fairly good ROM, slightly stiff. Negative drawer. 2+ pulses.  ASSESSMENT/PLAN:  Encounter for surveillance of contraceptive pills - refill Yaz, DAW as requested. sign ROR for UC to see if pap done.   - Plan: YAZ 3-0.02 MG tablet  Right ankle sprain, sequela - given exercises. refer for PT in [place]--pt to get [place] name of facility that will be convenient  Generalized anxiety disorder - encouraged counseling; start at [facility]. consider meds in future (doesn't sound appropriate at this time)  Other fatigue - reviewed ddx, normal lab eval 7 mos ago. discussed optimal sleep, healthy diet, exercise   Sign release form for UC records.  Referral to PT in [facility]. Patient to get [place] info for referral  Discussed depression and anieity Doesn't need meds at this point. Encouraged to start counseling at school  Fatigue--had normal lab eval in December. Discussed sleep needs and eating habits (avoid skipping meals, and avoid lows after high sugar load.)  More than 1/2 of 35 min visit was spent counseling

## 2015-03-13 ENCOUNTER — Ambulatory Visit (INDEPENDENT_AMBULATORY_CARE_PROVIDER_SITE_OTHER): Payer: No Typology Code available for payment source | Admitting: Family Medicine

## 2015-03-13 ENCOUNTER — Encounter: Payer: Self-pay | Admitting: Family Medicine

## 2015-03-13 VITALS — BP 98/60 | HR 80 | Ht 68.0 in | Wt 154.2 lb

## 2015-03-13 DIAGNOSIS — R5382 Chronic fatigue, unspecified: Secondary | ICD-10-CM

## 2015-03-13 DIAGNOSIS — J309 Allergic rhinitis, unspecified: Secondary | ICD-10-CM

## 2015-03-13 DIAGNOSIS — R0683 Snoring: Secondary | ICD-10-CM

## 2015-03-13 MED ORDER — FLUTICASONE PROPIONATE 50 MCG/ACT NA SUSP: 2.0000 | Freq: Every day | NASAL | Status: AC

## 2015-03-13 NOTE — Progress Notes (Signed)
Chief Complaint  Patient presents with  . Advice Only    consult to discuss sleep apnea.    Her parents are worried about sleep apnea, asking for sleep study. Both parents have sleep apnea.  They are trying to find cause for her chronic tiredness.  She feels tired a lot, never feels like she has a lot of energy.  She feels tired when she wakes up in the morning.  Only time she ever feels refreshed is sometimes after a nap.  One of her roommates says she snores.  She doesn't think she snores as loudly as her parents (who both have sleep apnea), just quietly, possibly related to allergies/nasal congestion. She usually gets 8-9 hours of sleep each night. Denies morning headaches. Only occasional, mild headaches.  Constantly tired for a long time, some worse related to anxiety/depression (more over the summer), but run down and tired since high school.  Sometimes the tiredness keeps her from doing things, but usually will still go out with friends, just aware of how tired she is all the time.   Allergies aren't too bad night now, just slightly congested. Energy worse related to some depression over the summer.  She plans to get counseling at school, but is willing to try medication to see if it is helpful before then.  Hasn't started PT yet for her ankle, plans to after starting school (in [place]). Hasn't been exercising as much due to ankle sprain, but doing home exercises for the ankle.  PMH, PSH, SH, FH reviewed and updated.  Outpatient Encounter Prescriptions as of 03/13/2015  Medication Sig Note  . YAZ 3-0.02 MG tablet Take 1 tablet by mouth daily.   Marland Kitchen albuterol (PROVENTIL HFA;VENTOLIN HFA) 108 (90 BASE) MCG/ACT inhaler Inhale 2 puffs into the lungs every 6 (six) hours as needed for wheezing. (Patient not taking: Reported on 02/12/2015) 07/11/2014: Uses infrequently (prior to exercise, and with illnesses)  . fluticasone (FLONASE) 50 MCG/ACT nasal spray Place 2 sprays into both nostrils  daily.    No facility-administered encounter medications on file as of 03/13/2015.   (flonase started today, not prior to visit).  No Known Allergies  ROS: no fever, chills, headaches (rare/mild), dizziness (rare, associated with headache), numbness, tingling.  Denies cough, shortness of breath, chest pain.  +nasal congestion. No urinary complaints.  No myalgias, arthralgias (just ankle pain s/p sprain).  Moods overall are good, no significant anxiety recently.  PHYSICAL EXAM: BP 98/60 mmHg  Pulse 80  Ht 5\' 8"  (1.727 m)  Wt 154 lb 3.2 oz (69.945 kg)  BMI 23.45 kg/m2  LMP 02/26/2015 Well developed, pleasant female in no distress, with occasional yawn during visit. HEENT: PERRL, EOMI, conjunctiva clear.  TM's and EAC's normal.  Nasal mucosa is moderately edematous, pale with clear mucus. Sinuses nontender. OP is clear Neck: no lymphadenopathy, thyromegaly or mass Heart: regular rate and rhythm without murmur Lungs: clear bilaterally Abdomen: soft, nontender, no organomegaly or mass Extremities: no edema Neuro: alert and oriented, normal cranial nerves, strength, gait Psych: normal mood, affect, hygiene and grooming. Appears slightly tired.  ASSESSMENT/PLAN:  Chronic fatigue - Plan: Split night study  Allergic rhinitis, unspecified allergic rhinitis type - Plan: fluticasone (FLONASE) 50 MCG/ACT nasal spray  Snoring - Plan: Split night study   Refer for sleep study--given family history, ongoing problem with fatigue. Partly done to appease parents, but also r/o sleep apnea given chronic fatigue. contirbuting factors are likely allergies and possibly depression/anxiety. Treat for allergies, and if no  sleep apnea or improvement with allergy treatment, then plan trial of prozac.   We are referring you for a sleep study.  Start flonase and claritin Use flonase 2 sprays into each nostril once daily.  Take gentle sniffs and aim slightly away from the midline/septum.  Use saline spray  frequently if having any trouble with nose bleeds. I would start claritin or allegra once daily along with the flonase. Use this instead of zyrtec, because zyrtec can make some people tired. You should overlap the pills with the nasal spray for at least a week, and if your allergies seem much better, you can stop the pills and continue the spray.  If your sleep study is normal, and your energy isn't any better after treating your allergies, then we can do the trial of prozac (fluoxetine) as we discussed (and plan to get counseling at school as well).   30 min visit, more than 1/2 spent counseling.

## 2015-03-13 NOTE — Patient Instructions (Signed)
   We are referring you for a sleep study.  Start flonase and claritin Use flonase 2 sprays into each nostril once daily.  Take gentle sniffs and aim slightly away from the midline/septum.  Use saline spray frequently if having any trouble with nose bleeds. I would start claritin or allegra once daily along with the flonase. Use this instead of zyrtec, because zyrtec can make some people tired. You should overlap the pills with the nasal spray for at least a week, and if your allergies seem much better, you can stop the pills and continue the spray.  If your sleep study is normal, and your energy isn't any better after treating your allergies, then we can do the trial of prozac (fluoxetine) as we discussed (and plan to get counseling at school as well).

## 2015-04-02 ENCOUNTER — Other Ambulatory Visit: Payer: Self-pay | Admitting: *Deleted

## 2015-04-02 ENCOUNTER — Telehealth: Payer: Self-pay | Admitting: *Deleted

## 2015-04-02 DIAGNOSIS — R5382 Chronic fatigue, unspecified: Secondary | ICD-10-CM

## 2015-04-02 DIAGNOSIS — R0683 Snoring: Secondary | ICD-10-CM

## 2015-04-02 NOTE — Telephone Encounter (Signed)
Called Friday and left message on my vm stating that they received a letter from their insurance that they will not pay for sleep study. (was this the one you were changing to home sleep study?)

## 2015-04-02 NOTE — Telephone Encounter (Signed)
Ok to change to home study--per paperwork from insurance, looks like that should be covered

## 2015-04-02 NOTE — Telephone Encounter (Signed)
Ordered home sleep study through [facility]-and informed patient's father.

## 2015-04-14 ENCOUNTER — Telehealth: Payer: Self-pay

## 2015-04-14 NOTE — Telephone Encounter (Signed)
Per Veronica's documentation, this was ordered through [facility] sleep [facility]. Please call over there and check on the status of this and advise the father.

## 2015-04-14 NOTE — Telephone Encounter (Signed)
Called [facility] sleep [facility] and they said they have been trying to get in touch with her but she wouldn't pick up and her voicemail was full so they couldn't talk to her so I called the father back and ave him their number so they could get in touch.

## 2015-04-14 NOTE — Telephone Encounter (Signed)
Miyako's dad called in regards to the home sleep study that was ordered. This was ordered on 9/07 and they haven't heard anything about this. They were wondering what you wanted to do about this and what the status of the order was or if they would be hearing anything.

## 2015-06-06 ENCOUNTER — Ambulatory Visit (HOSPITAL_BASED_OUTPATIENT_CLINIC_OR_DEPARTMENT_OTHER): Payer: No Typology Code available for payment source

## 2015-07-14 ENCOUNTER — Encounter: Payer: Self-pay | Admitting: Family Medicine

## 2015-07-14 ENCOUNTER — Ambulatory Visit (INDEPENDENT_AMBULATORY_CARE_PROVIDER_SITE_OTHER): Payer: No Typology Code available for payment source | Admitting: Family Medicine

## 2015-07-14 ENCOUNTER — Other Ambulatory Visit (HOSPITAL_COMMUNITY)
Admission: RE | Admit: 2015-07-14 | Discharge: 2015-07-14 | Disposition: A | Discharge status: Home / Self Care | Payer: No Typology Code available for payment source | Source: Ambulatory Visit | Attending: Family Medicine | Admitting: Family Medicine

## 2015-07-14 VITALS — BP 110/70 | HR 68 | Ht 68.0 in | Wt 151.4 lb

## 2015-07-14 DIAGNOSIS — Z0000 Encounter for general adult medical examination without abnormal findings: Secondary | ICD-10-CM | POA: Diagnosis not present

## 2015-07-14 DIAGNOSIS — Z113 Encounter for screening for infections with a predominantly sexual mode of transmission: Secondary | ICD-10-CM | POA: Insufficient documentation

## 2015-07-14 DIAGNOSIS — Z30018 Encounter for initial prescription of other contraceptives: Secondary | ICD-10-CM

## 2015-07-14 DIAGNOSIS — Z01419 Encounter for gynecological examination (general) (routine) without abnormal findings: Secondary | ICD-10-CM | POA: Insufficient documentation

## 2015-07-14 LAB — POCT URINALYSIS DIPSTICK
Bilirubin, UA: NEGATIVE
Blood, UA: NEGATIVE
Glucose, UA: NEGATIVE
KETONES UA: NEGATIVE
Leukocytes, UA: NEGATIVE
Nitrite, UA: NEGATIVE
PROTEIN UA: NEGATIVE
Spec Grav, UA: 1.015
Urobilinogen, UA: NEGATIVE
pH, UA: 6.5

## 2015-07-14 MED ORDER — ETONOGESTREL-ETHINYL ESTRADIOL 0.12-0.015 MG/24HR VA RING: VAGINAL_RING | VAGINAL | Status: DC

## 2015-07-14 NOTE — Progress Notes (Signed)
Chief Complaint  Patient presents with  . Annual Exam    nonfasting annual exam. Would like to discuss birth control options. Also thinks she may have had a sinus infection a few weeks ago, wants to know if it is still there-still having some nasal congestion and yellow, thick mucus.    Meagan Miller is a 19 y.o. female who presents for a complete physical.  She has the following concerns:  Fatigue--discussed last at her last visit in August.  Parents wanted her tested for sleep apnea.  Had home sleep study ordered .  Never had it done.  Energy is overall much better since being at [facility] been so biusy, has been distracted.   Anxiety/depression:  Moods overall are stable.  Hasn't noticed any worsening of moods since being off the OCP.  She never ended up finding a Social worker at school.  She has been too busy to really notice any significant problem (mostly related to the depression).  Anxiety has been tolerable, no panic attacks.  Not waking up as often in the middle of the night, sleeping better, so feels more rested in the morning than at her last visit.  Ankle sprain (right) a year ago--she never did the physical therapy in [facility]. Not having any problems, but has some recurrent discomfort if she runs a lot on it.    Exercise-induced asthma--hasn't been needing to use inhaler with exercise.  She does the stepper/elliptical at the gym. Didn't need inhaler with recent sinus infection.  She was sick during finals, thinks she had a sinus infection--too busy to get treated, but symptoms seems to have gotten better.  She still has some residual drainage/phlegm, which is yellow--overall much less. Sinus pain/pressure have resolved; denies fevers.  Nasal drainage has decreased a lot over the last 5 days, but still has some PND, and coughs up some phlegm.  Dysmenorrhea and PMDD/depression--She went back on Yaz in July.  She had trouble remembering to take it.  She would sometimes miss  pills and have bleeding mid-week.  She stopped taking it 2-2.5 months ago. She talked to friends who had IUD's. She talked to doctor on campus about different options, but wanted to talk to me about it before making a decision.  When she was on OCP's her periods were lighter, shorter, less painful, and moods and acne were a little better.  She denied side effects from the pills.Periods are back to lasting a full week since she has been off them, and acne has gotten a little worse also.  H/o KP (keratosis Pilaris)--has gotten worse on he cheeks, forehead and arms. Asking about a cream that she was prescribed many years ago (by derm).  Immunization History  Administered Date(s) Administered  . DTaP 10/04/1994, 11/18/1994, 01/31/1995, 11/03/1995, 12/10/1999  . HPV Quadrivalent 04/27/2006, 06/29/2006, 12/27/2007  . Hepatitis A 04/26/2005, 04/27/2006  . Hepatitis B Apr 05, 1994, 08/25/1994, 04/25/1995  . HiB (PRP-OMP) 10/04/1994, 11/18/1994, 01/31/1995, 11/03/1995  . IPV 10/04/1994, 11/18/1994, 01/31/1995, 12/10/1999  . Influenza Split 08/04/2009, 09/06/2011, 04/24/2012  . Influenza Whole 05/19/2004, 04/26/2005, 04/27/2006  . Influenza,inj,Quad PF,36+ Mos 07/12/2014  . Influenza-Unspecified 04/26/2015  . MMR 07/25/1995, 12/10/1999  . Meningococcal Conjugate 12/27/2007, 09/06/2011  . Tdap 04/26/2005, 09/09/2011  . Varicella 07/25/1995, 12/27/2007   Last Pap smear: never Dentist: twice yearly. Ophtho: many years Exercise: goes to the gym a few times/week, depending on how much work she has.  Walks a lot on campus.  Occasionally does some weights. Lipid screen: Lab  Results  Component Value Date   CHOL 202* 07/11/2014   HDL 68 07/11/2014   LDLCALC 104* 07/11/2014   TRIG 151* 07/11/2014   CHOLHDL 3.0 07/11/2014   Normal CBC last year, Vitamin D 37, normal TSH, negative HIV  Past Medical History  Diagnosis Date  . Depression   . Generalized anxiety disorder     previously treated with  zoloft (Dr. Sabra Heck, Dr. Darron Doom)  . ADHD (attention deficit hyperactivity disorder) 9th grade  . Asthma     Past Surgical History  Procedure Laterality Date  . Wisdom tooth extraction      Social History   Social History  . Marital Status: Single    Spouse Name: N/A  . Number of Children: N/A  . Years of Education: N/A   Occupational History  . student    Social History Main Topics  . Smoking status: Never Smoker   . Smokeless tobacco: Never Used  . Alcohol Use: No     Comment: once a week, 3 drinks  . Drug Use: No  . Sexual Activity:    Partners: Male    Birth Control/ Protection:    Other Topics Concern  . Not on file   Social History Narrative   Graduated from [employer]. Went to [employer] for 2 years, then transferred to [employer]. Lives with parents, sister. At school, lives in an apartment with a roommate.    Family History  Problem Relation Age of Onset  . Asthma Mother   . Anxiety disorder Mother   . Sleep apnea Mother   . Cancer Father 55    colon  . Sleep apnea Father   . Asthma Sister   . Anxiety disorder Sister   . Cancer Maternal Aunt     great aunt--ovarian cancer  . Anxiety disorder Maternal Aunt   . Anxiety disorder Maternal Uncle   . Anxiety disorder Maternal Grandmother   . Hypertension Maternal Grandmother   . Cancer Cousin     breast cancer (1st cousins once removed)--late 43's for 2 cousins  . Breast cancer Cousin   . Stroke Cousin   . Diabetes Neg Hx   . Breast cancer Paternal Grandmother     Outpatient Encounter Prescriptions as of 07/14/2015  Medication Sig Note  . albuterol (PROVENTIL HFA;VENTOLIN HFA) 108 (90 BASE) MCG/ACT inhaler Inhale 2 puffs into the lungs every 6 (six) hours as needed for wheezing. (Patient not taking: Reported on 02/12/2015) 07/11/2014: Uses infrequently (prior to exercise, and with illnesses)  . etonogestrel-ethinyl estradiol (NUVARING) 0.12-0.015 MG/24HR vaginal ring Insert vaginally and leave in  place for 3 consecutive weeks, then remove for 1 week.   . fluticasone (FLONASE) 50 MCG/ACT nasal spray Place 2 sprays into both nostrils daily. (Patient not taking: Reported on 07/14/2015)   . valACYclovir (VALTREX) 1000 MG tablet Take 1,000 mg by mouth 2 (two) times daily. Reported on 07/14/2015 07/14/2015: 1056m BID, once at onset of cold sore  . YAZ 3-0.02 MG tablet Take 1 tablet by mouth daily. (Patient not taking: Reported on 07/14/2015) 07/14/2015: Stopped taking about 2 months ago   No facility-administered encounter medications on file as of 07/14/2015.  (Nuva Ring rx'd today, not prior to visit)  No Known Allergies  ROS: No fevers, headaches, chest pain, palpitations, nausea, vomiting, bowel changes, vaginal discharge, urinary complaints, joint pains, bleeding/bruising, skin lesions/rashes (just mild acne, and rough skin/KP), cough, shortness of breath. Denies depression. Anxiety is stable.Burping unchanged; still does it some. Doesn't bother her at  all (bothers her parents more than her, occurs more when at home due to drinking some soda then, which she doesn't usually) URI symptoms per HPI No abnormal vaginal discharge. No odor, itch, discomfort. She is not currently sexually active Denies insomnia, sleeping better   PHYSICAL EXAM:  BP 110/70 mmHg  Pulse 68  Ht 5' 8"  (1.727 m)  Wt 151 lb 6.4 oz (68.675 kg)  BMI 23.03 kg/m2  LMP 06/26/2015 LMP 12/8 per pt  General Appearance:    Alert, cooperative, no distress, appears stated age  Head:    Normocephalic, without obvious abnormality, atraumatic  Eyes:    PERRL, conjunctiva/corneas clear, EOM's intact, fundi    benign  Ears:    Normal TM's and external ear canals  Nose:   Nares normal, mucosa has mod edema with clear mucus:  No sinus tenderness  Throat:   Lips, mucosa, and tongue normal; teeth and gums normal  Neck:   Supple, no lymphadenopathy;  thyroid:  no   enlargement/tenderness/nodules; no carotid   bruit or  JVD  Back:    Spine nontender, no curvature, ROM normal, no CVA     tenderness  Lungs:     Clear to auscultation bilaterally without wheezes, rales or     ronchi; respirations unlabored  Chest Wall:    No tenderness or deformity   Heart:    Regular rate and rhythm, S1 and S2 normal, no murmur, rub   or gallop  Breast Exam:    No tenderness, masses, or nipple discharge or inversion.      No axillary lymphadenopathy  Abdomen:     Soft, non-tender, nondistended, normoactive bowel sounds,    no masses, no hepatosplenomegaly  Genitalia:    Normal external genitalia without lesions.  BUS and vagina normal; cervix without lesions, or cervical motion tenderness. No abnormal vaginal discharge.  Uterus and adnexa not enlarged, nontender, no masses.  Pap performed  Rectal:    Not performed due to age<40 and no related complaints  Extremities:   No clubbing, cyanosis or edema  Pulses:   2+ and symmetric all extremities  Skin:   Skin color, texture, turgor normal, no rashes or lesions. Some prominent follicles on her upper posterior arms (rough), otherwise normal.  Lymph nodes:   Cervical, supraclavicular, and axillary nodes normal  Neurologic:   CNII-XII intact, normal strength, sensation and gait; reflexes 2+ and symmetric throughout          Psych:   Normal mood, affect, hygiene and grooming.    Normal urine dip  ASSESSMENT/PLAN:  Annual physical exam - pap done, since she is less than a week from turning 19 and has been sexually active in the past - Plan: POCT Urinalysis Dipstick, Visual acuity screening, Cytology - PAP [place]  Encounter for initial prescription of other contraceptives - risks, side effects, proper use all reviewed, as well as continuing condom use for STD prevention. If not tolerating, go back to [place]: etonogestrel-ethinyl estradiol (NUVARING) 0.12-0.015 MG/24HR vaginal ring   Discussed Depo Provera, IUD, OCP, Nuva Ring, patches, implanon/nexplanon. Nuva Ring seems  like the best option Sent rx, she can decide to restart yaz/gianvi vs nuva ring with her next cycle. Discussed using alarms/reminders in her phone for pills or ring  Recent URI, seems to be improving.  Reviewed s/sx bacterial infection and call for ABX if worse.  Exam looked more like allergies.  Discussed monthly self breast exams; at least 30 minutes of aerobic activity at  least 5 days/week; proper sunscreen use reviewed; healthy diet, including goals of calcium and vitamin D intake and alcohol recommendations (less than or equal to 1 drink/day after age 41, risks of binge drinking) reviewed; regular seatbelt use; changing batteries in smoke detectors.  Immunization recommendations discussed--UTD.    Call if sinus symptoms worsen over the next week, as that would be a sign of a secondary bacterial infection that would need antibiotics.   After your next period starts, you need to decide if you are going to start the [place] (which stays in place for three weeks, then take it out for a week, you get your period, and then put a new ring back in (in three weeks, out for one, then repeat), vs restarting the birth control pills, and you should still have refills on those.  Either way USE THE ALARMS IN YOUR PHONE.  Keratosis Pilaris--you can try Lachydrin--there is now an OTC form of this.  Keeping the skin well moisturized, especially in the winter, is also very important.

## 2015-07-14 NOTE — Patient Instructions (Signed)
  HEALTH MAINTENANCE RECOMMENDATIONS:  It is recommended that you get at least 30 minutes of aerobic exercise at least 5 days/week (for weight loss, you may need as much as 60-90 minutes). This can be any activity that gets your heart rate up. This can be divided in 10-15 minute intervals if needed, but try and build up your endurance at least once a week.  Weight bearing exercise is also recommended twice weekly.  Eat a healthy diet with lots of vegetables, fruits and fiber.  "Colorful" foods have a lot of vitamins (ie green vegetables, tomatoes, red peppers, etc).  Limit sweet tea, regular sodas and alcoholic beverages, all of which has a lot of calories and sugar.  Up to 1 alcoholic drink daily may be beneficial for women (unless trying to lose weight, watch sugars).  Drink a lot of water.  Calcium recommendations are 1200-1500 mg daily (1500 mg for postmenopausal women or women without ovaries), and vitamin D 1000 IU daily.  This should be obtained from diet and/or supplements (vitamins), and calcium should not be taken all at once, but in divided doses.  Monthly self breast exams and yearly mammograms for women over the age of 21 is recommended.  Sunscreen of at least SPF 30 should be used on all sun-exposed parts of the skin when outside between the hours of 10 am and 4 pm (not just when at beach or pool, but even with exercise, golf, tennis, and yard work!)  Use a sunscreen that says "broad spectrum" so it covers both UVA and UVB rays, and make sure to reapply every 1-2 hours.  Remember to change the batteries in your smoke detectors when changing your clock times in the spring and fall.  Use your seat belt every time you are in a car, and please drive safely and not be distracted with cell phones and texting while driving.   Call if sinus symptoms worsen over the next week, as that would be a sign of a secondary bacterial infection that would need antibiotics.   After your next period  starts, you need to decide if you are going to start the Nuva Ring (which stays in place for three weeks, then take it out for a week, you get your period, and then put a new ring back in (in three weeks, out for one, then repeat), vs restarting the birth control pills, and you should still have refills on those.  Either way USE THE ALARMS IN YOUR PHONE.  Keratosis Pilaris--you can try Lachydrin--there is now an OTC form of this.  Keeping the skin well moisturized, especially in the winter, is also very important.

## 2015-07-16 LAB — CYTOLOGY - PAP

## 2016-07-13 NOTE — Progress Notes (Deleted)
Meagan Miller is a 20 y.o. female who presents for a complete physical.  She has the following concerns:  Anxiety/depression:  Moods overall are stable.  She never ended up finding a Social worker at school.  She has been too busy to really notice any significant problem (mostly related to the depression).  Anxiety has been tolerable, no panic attacks.  Not waking up as often in the middle of the night, sleeping better, so feels more rested in the morning than at her last visit.  Exercise-induced asthma--hasn't been needing to use inhaler with exercise.  She does the stepper/elliptical at the gym.   Dysmenorrhea and PMDD/depression--She went back on Yaz in July, 2016.  She had trouble remembering to take it.  She would sometimes miss pills and have bleeding mid-week.  She stopped taking it in the fall of 2016. At her physical last year we discussed many options, and switched her to [employer].   H/o KP (keratosis Pilaris)--we had discussed lac-hydrin and OTC forms last year  Immunization History  Administered Date(s) Administered  . DTaP 10/04/1994, 11/18/1994, 01/31/1995, 11/03/1995, 12/10/1999  . HPV Quadrivalent 04/27/2006, 06/29/2006, 12/27/2007  . Hepatitis A 04/26/2005, 04/27/2006  . Hepatitis B Jan 19, 1996, 08/25/1994, 04/25/1995  . HiB (PRP-OMP) 10/04/1994, 11/18/1994, 01/31/1995, 11/03/1995  . IPV 10/04/1994, 11/18/1994, 01/31/1995, 12/10/1999  . Influenza Split 08/04/2009, 09/06/2011, 04/24/2012  . Influenza Whole 05/19/2004, 04/26/2005, 04/27/2006  . Influenza,inj,Quad PF,36+ Mos 07/12/2014  . Influenza-Unspecified 04/26/2015  . MMR 07/25/1995, 12/10/1999  . Meningococcal Conjugate 12/27/2007, 09/06/2011  . Tdap 04/26/2005, 09/09/2011  . Varicella 07/25/1995, 12/27/2007   Last Pap smear: never Dentist: twice yearly. Ophtho: many years Exercise: goes to the gym a few times/week, depending on how much work she has.  Walks a lot on campus.  Occasionally does some  weights. Lipid screen Lab Results  Component Value Date   CHOL 202 (H) 07/11/2014   HDL 68 07/11/2014   LDLCALC 104 (H) 07/11/2014   TRIG 151 (H) 07/11/2014   CHOLHDL 3.0 07/11/2014   Normal CBC, Vitamin D 37, normal TSH, negative HIV in 2015   ROS: No fevers, headaches, chest pain, palpitations, nausea, vomiting, bowel changes, vaginal discharge, urinary complaints, joint pains, bleeding/bruising, skin lesions/rashes (just mild acne, and rough skin/KP), cough, shortness of breath. Denies depression. Anxiety is stable.Burping unchanged; still does it some. Doesn't bother her at all (bothers her parents more than her, occurs more when at home due to drinking some soda then, which she doesn't usually) URI symptoms per HPI No abnormal vaginal discharge. No odor, itch, discomfort. She is not currently sexually active Denies insomnia, sleeping better   PHYSICAL EXAM:  Wt 151 last year   General Appearance:    Alert, cooperative, no distress, appears stated age  Head:    Normocephalic, without obvious abnormality, atraumatic  Eyes:    PERRL, conjunctiva/corneas clear, EOM's intact, fundi    benign  Ears:    Normal TM's and external ear canals  Nose:   Nares normal, mucosa has mod edema with clear mucus:  No sinus tenderness  Throat:   Lips, mucosa, and tongue normal; teeth and gums normal  Neck:   Supple, no lymphadenopathy;  thyroid:  no   enlargement/tenderness/nodules; no carotid   bruit or JVD  Back:    Spine nontender, no curvature, ROM normal, no CVA     tenderness  Lungs:     Clear to auscultation bilaterally without wheezes, rales or     ronchi; respirations unlabored  Chest Wall:  No tenderness or deformity   Heart:    Regular rate and rhythm, S1 and S2 normal, no murmur, rub   or gallop  Breast Exam:    No tenderness, masses, or nipple discharge or inversion.      No axillary lymphadenopathy  Abdomen:     Soft, non-tender, nondistended, normoactive bowel sounds,     no masses, no hepatosplenomegaly  Genitalia:    Normal external genitalia without lesions.  BUS and vagina normal; cervix without lesions, or cervical motion tenderness. No abnormal vaginal discharge.  Uterus and adnexa not enlarged, nontender, no masses.  Pap performed  Rectal:    Not performed due to age<40 and no related complaints  Extremities:   No clubbing, cyanosis or edema  Pulses:   2+ and symmetric all extremities  Skin:   Skin color, texture, turgor normal, no rashes or lesions. Some prominent follicles on her upper posterior arms (rough), otherwise normal.  Lymph nodes:   Cervical, supraclavicular, and axillary nodes normal  Neurologic:   CNII-XII intact, normal strength, sensation and gait; reflexes 2+ and symmetric throughout                                Psych:   Normal mood, affect, hygiene and grooming  ASSESSMENT/PLAN:  Discussed monthly self breast exams; at least 30 minutes of aerobic activity at least 5 days/week; proper sunscreen use reviewed; healthy diet, including goals of calcium and vitamin D intake and alcohol recommendations (less than or equal to 1 drink/day after age 19, risks of binge drinking) reviewed; regular seatbelt use; changing batteries in smoke detectors.  Immunization recommendations discussed--continue yearly flu shots  Flu shot if not yet had Pap

## 2016-07-15 ENCOUNTER — Telehealth: Payer: Self-pay | Admitting: *Deleted

## 2016-07-15 ENCOUNTER — Encounter: Payer: No Typology Code available for payment source | Admitting: Family Medicine

## 2016-07-15 DIAGNOSIS — Z0000 Encounter for general adult medical examination without abnormal findings: Secondary | ICD-10-CM

## 2016-07-15 NOTE — Telephone Encounter (Signed)

## 2016-07-15 NOTE — Telephone Encounter (Signed)
She no-showed her CPE. Choice E--send no show letter and charge no show fee.

## 2016-07-30 ENCOUNTER — Encounter: Payer: Self-pay | Admitting: Family Medicine

## 2016-08-04 ENCOUNTER — Telehealth: Payer: Self-pay | Admitting: Family Medicine

## 2016-08-04 NOTE — Telephone Encounter (Signed)
Dad called because he recd a no show fee letter for his daughter and wanted to know why.  I explained she missed her appt.  He said did anyone send her a reminder.  I explained our auto system calls 2 days in advance for appt and she didn't answer.  He said she never answers she only uses text.  He said his daughter was only here for a short while from school and $50.00 will be tough on her to pay.  To please ask Dr. Lynelle DoctorKnapp to reconsider the fee and call him back. I went ahead and sent pt a my chart text to sign up.  He said could she reschedule and I said yes but it would be atleast 7 months out and how as she going to be able to remember it, since he said it was made a year ago and that was why she forgot.  He said we would need to send her a text. Please advise pt [phone]

## 2016-08-04 NOTE — Telephone Encounter (Signed)
I'm okay with waiving the fee (I know family personally).  However, it is not our responsibility for reminders beyond our normal routine (we can't promise to text her about her appointment).  She will get a reminder through Mychart if she signs up.  She obviously uses her phone, so encourage her to put it in her calendar with a reminder. Okay to reschedule (she is in school at [employer] now)

## 2016-08-05 NOTE — Telephone Encounter (Signed)
Called pt reached voice mail left message.

## 2016-12-24 ENCOUNTER — Encounter: Payer: Self-pay | Admitting: Family Medicine

## 2016-12-24 ENCOUNTER — Ambulatory Visit (INDEPENDENT_AMBULATORY_CARE_PROVIDER_SITE_OTHER): Payer: BLUE CROSS/BLUE SHIELD | Admitting: Family Medicine

## 2016-12-24 VITALS — BP 106/69 | HR 88 | Temp 98.2°F | Resp 16 | Ht 68.0 in | Wt 148.6 lb

## 2016-12-24 DIAGNOSIS — N946 Dysmenorrhea, unspecified: Secondary | ICD-10-CM | POA: Diagnosis not present

## 2016-12-24 DIAGNOSIS — Z7189 Other specified counseling: Secondary | ICD-10-CM

## 2016-12-24 DIAGNOSIS — Z7184 Encounter for health counseling related to travel: Secondary | ICD-10-CM

## 2016-12-24 MED ORDER — AZITHROMYCIN 500 MG PO TABS: ORAL_TABLET | ORAL | 0 refills | Status: AC

## 2016-12-24 MED ORDER — DOXYCYCLINE HYCLATE 100 MG PO TABS: 100.0000 mg | ORAL_TABLET | Freq: Two times a day (BID) | ORAL | 0 refills | Status: AC

## 2016-12-24 MED ORDER — TYPHOID VACCINE PO CPDR: 1.0000 | DELAYED_RELEASE_CAPSULE | ORAL | 0 refills | Status: DC

## 2016-12-24 MED ORDER — TYPHOID VI POLYSACCHARIDE VACC 25 MCG/0.5ML IM SOLN: 0.5000 mL | Freq: Once | INTRAMUSCULAR | 0 refills | Status: AC

## 2016-12-24 MED ORDER — TRIAMCINOLONE ACETONIDE 0.1 % EX CREA: 1.0000 "application " | TOPICAL_CREAM | Freq: Two times a day (BID) | CUTANEOUS | 0 refills | Status: AC

## 2016-12-24 NOTE — Patient Instructions (Addendum)
     IF you received an x-ray today, you will receive an invoice from [facility]. Please contact [facility] at [phone] with questions or concerns regarding your invoice.   IF you received labwork today, you will receive an invoice from [facility]. Please contact [facility] at [phone] with questions or concerns regarding your invoice.   Our billing staff will not be able to assist you with questions regarding bills from these companies.  You will be contacted with the lab results as soon as they are available. The fastest way to get your results is to activate your My Chart account. Instructions are located on the last page of this paperwork. If you have not heard from us regarding the results in 2 weeks, please contact this office.     Recommended for most travelers, especially those who are staying with friends or relatives; visiting smaller cities, villages, or rural areas where exposure might occur through food or water; or prone to "adventurous eating" Traveling with Children  Injectable vaccine is indicated for children aged ?2 years. Oral vaccine is indicated for children aged ?6 years.   Azithromycin 1000mg  at once for traveler's diarrhea (diarrhea with fever)  Doxycycline  Take one tablet twice a day for  - UTI - infected skin wound - ear infection, sinus infection, bronchitis, pneumonia -  Insect or animal bites

## 2016-12-24 NOTE — Progress Notes (Signed)
Chief Complaint  Patient presents with  . 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 Her stay is free but her meals and room and board will be with the family who also runs a hostel.  She wanted to know what vaccines she needed.  Pt has mirena which helps with her dysmenorrhea No LMP recorded.   Past Medical History:  Diagnosis Date  . ADHD (attention deficit hyperactivity disorder) 9th grade  . Asthma   . Depression   . Generalized anxiety disorder    previously treated with zoloft (Dr. Dub Mikes, Dr. Hal Hope)    Current Outpatient Prescriptions  Medication Sig Dispense Refill  . albuterol (PROVENTIL HFA;VENTOLIN HFA) 108 (90 BASE) MCG/ACT inhaler Inhale 2 puffs into the lungs every 6 (six) hours as needed for wheezing. 1 Inhaler 1  . fluticasone (FLONASE) 50 MCG/ACT nasal spray Place 2 sprays into both nostrils daily. 16 g 6  . levonorgestrel (MIRENA) 20 MCG/24HR IUD 1 each by Intrauterine route once.    . valACYclovir (VALTREX) 1000 MG tablet Take 1,000 mg by mouth 2 (two) times daily. Reported on 07/14/2015    . azithromycin (ZITHROMAX) 500 MG tablet Take 1000mg  in one dose for traveler's diarrhea 4 tablet 0  . doxycycline (VIBRA-TABS) 100 MG tablet Take 1 tablet (100 mg total) by mouth 2 (two) times daily. 20 tablet 0  . triamcinolone cream (KENALOG) 0.1 % Apply 1 application topically 2 (two) times daily. 30 g 0  . typhoid polysaccharide (TYPHIM VI) 25 MCG/0.5ML injection Inject 0.5 mLs into the muscle once. 0.5 mL 0   No current facility-administered medications for this visit.     Allergies: No Known Allergies  Past Surgical History:  Procedure Laterality Date  . WISDOM TOOTH EXTRACTION      Social  History   Social History  . Marital status: Single    Spouse name: N/A  . Number of children: N/A  . Years of education: N/A   Occupational History  . student    Social History Main Topics  . Smoking status: Never Smoker  . Smokeless tobacco: Never Used  . Alcohol use No     Comment: once a week, 3 drinks  . Drug use: No  . Sexual activity: Not Currently    Partners: Male    Birth control/ protection:    Other Topics Concern  . None   Social History Narrative   Graduated from [employer]. Went to [employer] for 2 years, then transferred to [employer]. Lives with parents, sister. At school, lives in an apartment with a roommate.    ROS  Objective: Vitals:   12/24/16 1412  BP: 106/69  Pulse: 88  Resp: 16  Temp: 98.2 F (36.8 C)  TempSrc: Oral  SpO2: 98%  Weight: 148 lb 9.6 oz (67.4 kg)  Height: 5\' 8"  (1.727 m)    Physical Exam  Constitutional: She appears well-developed and well-nourished.  HENT:  Head: Normocephalic and atraumatic.  Eyes: Conjunctivae and EOM are normal.  Pulmonary/Chest: Effort normal.    Assessment and Plan Meagan Miller was seen today for other.  Diagnoses and all orders for this visit:  Travel advice encounter  Advised pt to take zithromax for traveler's diarrhea and discussed the symptoms Doxycycline given for skin infection, uti, uri, animal bites and instructed on when and who to take Gave prescription for typhoid vaccine Has mirena for contraception Other orders -     azithromycin (ZITHROMAX) 500 MG tablet; Take 1000mg  in one dose for traveler's diarrhea -     doxycycline (VIBRA-TABS) 100 MG tablet; Take 1 tablet (100 mg total) by mouth 2 (two) times daily. -     triamcinolone cream (KENALOG) 0.1 %; Apply 1 application topically 2 (two) times daily. -     typhoid polysaccharide (TYPHIM VI) 25 MCG/0.5ML injection; Inject 0.5 mLs into the muscle once.  Dysmenorrhea -  Resolved with Mirena   Meagan Miller A Creta LevinStallings

## 2018-10-21 ENCOUNTER — Telehealth: Payer: No Typology Code available for payment source | Admitting: Family

## 2018-10-21 DIAGNOSIS — Z7189 Other specified counseling: Secondary | ICD-10-CM

## 2018-10-21 DIAGNOSIS — R6889 Other general symptoms and signs: Secondary | ICD-10-CM

## 2018-10-21 MED ORDER — ALBUTEROL SULFATE HFA 108 (90 BASE) MCG/ACT IN AERS: 2.0000 | INHALATION_SPRAY | Freq: Four times a day (QID) | RESPIRATORY_TRACT | 0 refills | Status: AC | PRN

## 2018-10-21 NOTE — Progress Notes (Signed)
E-Visit for Corona Virus Screening  Based on your current symptoms, you may very well have the virus, however your symptoms are mild. Currently, not all patients are being tested. If the symptoms are mild and there is not a known exposure, performing the test is not indicated.  Approximately 5 minutes was spent documenting and reviewing patient's chart.   Coronavirus disease 2019 (COVID-19) is a respiratory illness that can spread from person to person. The virus that causes COVID-19 is a new virus that was first identified in the country of [place] but is now found in multiple other countries and has spread to the [place].  Symptoms associated with the virus are mild to severe fever, cough, and shortness of breath. There is currently no vaccine to protect against COVID-19, and there is no specific antiviral treatment for the virus.   To be considered HIGH RISK for Coronavirus (COVID-19), you have to meet the following criteria:  . Traveled to [place], [place], [place], [place] or [place]; or in the [place] to [place], [place], [place], or [place]; and have fever, cough, and shortness of breath within the last 2 weeks of travel OR  . Been in close contact with a person diagnosed with COVID-19 within the last 2 weeks and have fever, cough, and shortness of breath  . IF YOU DO NOT MEET THESE CRITERIA, YOU ARE CONSIDERED LOW RISK FOR COVID-19.   It is vitally important that if you feel that you have an infection such as this virus or any other virus that you stay home and away from places where you may spread it to others.  You should self-quarantine for 14 days if you have symptoms that could potentially be coronavirus and avoid contact with people age 65 and older.   You can use medication such as A prescription inhaler called Albuterol MDI 90 mcg /actuation 2 puffs every 4 hours as needed for shortness of breath, wheezing, cough.  You may also take acetaminophen (Tylenol) as needed  for fever.   Reduce your risk of any infection by using the same precautions used for avoiding the common cold or flu:  . Wash your hands often with soap and warm water for at least 20 seconds.  If soap and water are not readily available, use an alcohol-based hand sanitizer with at least 60% alcohol.  . If coughing or sneezing, cover your mouth and nose by coughing or sneezing into the elbow areas of your shirt or coat, into a tissue or into your sleeve (not your hands). . Avoid shaking hands with others and consider head nods or verbal greetings only. . Avoid touching your eyes, nose, or mouth with unwashed hands.  . Avoid close contact with people who are sick. . Avoid places or events with large numbers of people in one location, like concerts or sporting events. . Carefully consider travel plans you have or are making. . If you are planning any travel outside or inside the [place], visit the CDC's Travelers' Health webpage for the latest health notices. . If you have some symptoms but not all symptoms, continue to monitor at home and seek medical attention if your symptoms worsen. . If you are having a medical emergency, call 911.  HOME CARE . Only take medications as instructed by your medical team. . Drink plenty of fluids and get plenty of rest. . A steam or ultrasonic humidifier can help if you have congestion.   GET HELP RIGHT AWAY IF: .   IF: . You develop worsening fever. . You become short of breath . You cough up blood. . Your symptoms become more severe MAKE SURE YOU   Understand these instructions.  Will watch your condition.  Will get help right away if you are not doing well or get worse.  Your e-visit answers were reviewed by a board certified advanced clinical practitioner to complete your personal care plan.  Depending on the condition, your plan could have included both over the counter or prescription medications.  If there is a problem please reply once you have  received a response from your provider. Your safety is important to [place].  If you have drug allergies check your prescription carefully.    You can use MyChart to ask questions about today's visit, request a non-urgent call back, or ask for a work or school excuse for 24 hours related to this e-Visit. If it has been greater than 24 hours you will need to follow up with your provider, or enter a new e-Visit to address those concerns. You will get an e-mail in the next two days asking about your experience.  I hope that your e-visit has been valuable and will speed your recovery. Thank you for using e-visits.

## 2022-09-02 ENCOUNTER — Encounter: Payer: Self-pay | Admitting: *Deleted
